# Patient Record
Sex: Male | Born: 1955
Health system: Southern US, Community
[De-identification: ages and names within clinical notes are randomized; demographics above are authoritative.]

## PROBLEM LIST (undated history)

## (undated) DIAGNOSIS — I2510 Atherosclerotic heart disease of native coronary artery without angina pectoris: Secondary | ICD-10-CM

## (undated) DIAGNOSIS — I10 Essential (primary) hypertension: Secondary | ICD-10-CM

## (undated) DIAGNOSIS — E7800 Pure hypercholesterolemia, unspecified: Secondary | ICD-10-CM

## (undated) DIAGNOSIS — M109 Gout, unspecified: Secondary | ICD-10-CM

## (undated) HISTORY — PX: CAROTID STENT: SHX1301

---

## 2000-10-10 ENCOUNTER — Emergency Department (HOSPITAL_COMMUNITY): Admission: EM | Admit: 2000-10-10 | Discharge: 2000-10-10 | Payer: Self-pay | Admitting: Emergency Medicine

## 2001-06-03 ENCOUNTER — Encounter: Payer: Self-pay | Admitting: Emergency Medicine

## 2001-06-03 ENCOUNTER — Inpatient Hospital Stay (HOSPITAL_COMMUNITY): Admission: AD | Admit: 2001-06-03 | Discharge: 2001-06-05 | Payer: Self-pay | Admitting: Cardiology

## 2006-08-15 ENCOUNTER — Ambulatory Visit: Payer: Self-pay | Admitting: Cardiology

## 2006-08-28 ENCOUNTER — Ambulatory Visit: Payer: Self-pay | Admitting: Internal Medicine

## 2006-09-03 ENCOUNTER — Ambulatory Visit: Payer: Self-pay

## 2006-09-17 ENCOUNTER — Encounter (INDEPENDENT_AMBULATORY_CARE_PROVIDER_SITE_OTHER): Payer: Self-pay | Admitting: Specialist

## 2006-09-17 ENCOUNTER — Ambulatory Visit: Payer: Self-pay | Admitting: Internal Medicine

## 2008-03-25 ENCOUNTER — Ambulatory Visit: Payer: Self-pay | Admitting: Cardiology

## 2008-04-02 ENCOUNTER — Ambulatory Visit: Payer: Self-pay | Admitting: Cardiology

## 2009-05-10 ENCOUNTER — Encounter (INDEPENDENT_AMBULATORY_CARE_PROVIDER_SITE_OTHER): Payer: Self-pay | Admitting: *Deleted

## 2010-11-21 NOTE — Assessment & Plan Note (Signed)
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  He is currently interested in raising funds for  a research project that involves a new cardiac marker to detect acute  infarction at point of care.     Bruce Elvera Lennox Juanda Chance, MD, [facility]     BRB/MedQ  DD: 03/25/2008  DT: 03/25/2008  Job #: [id number]

## 2010-11-21 NOTE — Assessment & Plan Note (Signed)
[facility]                            CARDIOLOGY OFFICE NOTE   NAME:[name], [name]                        MRN:          [id number]  DATE:03/25/2008                            DOB:          1956-03-03    PRIMARY CARE PHYSICIAN:  [name] Garbe, MD   CLINICAL HISTORY:  [name] returned for followup management of his  coronary heart disease.  He had a diaphragmatic wall infarction in 2002  treated with a stent to the right coronary artery.  He has done quite  well from a cardiac standpoint since that time with no chest pain,  shortness of breath, or palpitations.   However, his risk factor control has been suboptimal.  He continues to  smoke, half to three-quarters packs cigarettes a day.  He had a recent  lipid profile by Dr. Wylene [name], which showed an HDL of 41, LDL of 49,  triglyceride of 585, and total cholesterol of 207.  His blood pressure  was also elevated today.   PAST HISTORY:  Significant for hypertension, and hyperlipidemia, and  cigarettes.   CURRENT MEDICATIONS:  1. Lipitor 80 mg daily.  2. TriCor 145 mg daily.  3. Chlorthalidone.  4. Plavix.  5. Toprol-XL 100 mg daily.  6. Wellbutrin 150 mg daily.  7. Potassium.  8. Aspirin.  9. Multivitamins.  10.Altace 20 mg daily.  11.Fish oil 1 capsule daily.   PHYSICAL EXAMINATION:  VITAL SIGNS:  Blood pressure is 150/60 and the  pulse is 61 and regular.  NECK:  There was no venous distention.  The carotid pulses were full  without bruits.  CHEST:  Clear.  CARDIAC:  Rhythm was regular.  There were no murmurs or gallops.  ABDOMEN:  Soft.  Normal bowel sounds.  EXTREMITIES:  Peripheral pulses were full with no peripheral edema.   Electrocardiogram showed an old diaphragmatic wall infarction.   IMPRESSION:  1. Coronary artery disease, status post diaphragmatic wall infarction      in 2002, treated with stenting of the right coronary artery.  2. Ejection fraction was 55%.  3.  Continued cigarette use.  4. Hypertension.  5. Hyperlipidemia with high triglyceride.  6. History of gout.   RECOMMENDATIONS:  I talked to [name] some further about cigarette  smoking and he may go ahead and try the Chantix with Dr. Wylene [name] gave  him along with patches.  His blood pressures are up today and in order  to bring it down he is on diuretic and an maximum dose Altace.  He said  his blood pressure was good in Dr. Deneen [name] office, so we does have him  recheck and if it remains elevated, we will need to reconsider another  drug.  Exercise and salt restriction can help with this bring it down.  His triglycerides are quite high and we might consider increasing the  dose of fish oil.  He asked about the prescription form of fish oil and  we prescribed the Lovaza 2 g twice a day.  We will arrange a cardiac  followup in a year.   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  He is currently interested in raising  funds for our research project that involves a new cardiac marker to  detect acute infarction at point of care.     Bruce Elvera Lennox Juanda Chance, MD, [facility]  Electronically Signed    BRB/MedQ  DD: 03/25/2008  DT: 03/25/2008  Job #: [id number]

## 2010-11-24 NOTE — Discharge Summary (Signed)
[place]  Patient:    [name], [name] Visit Number: [id number] MRN: [id number]          Service Type: EMS Location: ED Attending Physician:  Sandi Raveling Dictated by:   Tereso Newcomer, P.A.-C. Admit Date:  06/03/2001 Discharge Date: 06/03/2001   CC:         Emer[facility]are in [place]   Discharge Summary  DATE OF BIRTH:  Apr 21, 1956  DISCHARGE DIAGNOSES: 1. Status post acute inferior myocardial infarction treated with percutaneous    transluminal coronary angioplasty and stenting to the right coronary artery    with reduction of stenosis of 95 to 0% by Dr. Charlies Constable on June 03, 2001. 2. Coronary artery disease. 3. Ejection fraction approximately 55%. 4. Hypertension. 5. Tobacco abuse. 6. Mixed dyslipidemia (triglycerides 561, HDL 31, total cholesterol 478, LDL    not calculated).  HOSPITAL COURSE:  This 55 year old male presented to the emergency room with two hours of anterior chest pressure moderately severe with radiation to his arms.  He also complained of malaise.  He denied dyspnea and nausea.  His blood pressure on admission was 180/120.  Sublingual nitroglycerin almost relieved his pain.  He was placed on IV nitroglycerin, aspirin, and heparin. He also received morphine at a total of 8 mg.  EKG revealed signs of acute inferior posterior MI and he was in normal sinus rhythm.  It was decided that he should be taken emergently to the cardiac catheterization lab.  Dr. Charlies Constable performed the procedure.  His coronary angiogram revealed LAD and circumflex were normal and his RCA was 95% mid and 90% acute marginal stenosis.  His LV revealed inferior apical akinesis and EF of 55%.  Stenting was performed to the RCA with reduction stenosis of 95 to 0%.  The patient was placed on a low-risk protocol.  Discharge is targeted for Thursday, November 28.  The patient remained stable post catheterization without  any complications.  His groin was stable without bruits or stenosis.  He did have some persistent ST elevation post procedure, but this had resolved by the morning of November 27.  A stent was started for his abnormal lipid profile. He was placed on Lopressor on admission.  This was adjusted for better blood pressure control on the morning of November 28.  Patient was found to be in stable condition.  His blood pressure was 160/95, heart rate 95, and his catheter site was benign.  His peak CK was 1700.  He was felt stable enough for discharge to my home.  Follow-up is recommended in two weeks and he will continue on Diovan, aspirin, beta blocker, ACE inhibitor, and HMG co A reductase inhibitor.  LABS:  White blood cell count 20,200, hemoglobin 14.7, hematocrit 41.9, platelet count 315,000, INR 1.0.  Sodium 136, potassium 4.1, chloride 107, CO2 22, glucose 150, BUN 12, creatinine 0.9, total bilirubin 0.6, alkaline phosphatase 73, ST 22, ALT 27, total protein 7.4, albumin 4.3.  Peak CK 1673, CK-MB 330.5.  Liver profile as noted in discharge diagnoses.  Chest x-ray on admission:  Cardiomegaly with mild vascular congestion.  DISCHARGE MEDICATIONS: 1. Plavix 75 mg q.d. x 1 month. 2. Coated aspirin 325 ng q.d. 3. Lipitor 10 mg q.d. 4. Diovan 300 mg q.d. 5. Toprol XL 100 mg q.d. 6. Chlorthalidone 25 mg one-half tablet q.d.  ACTIVITY:  No lifting, sexual activity, or heavy exertion until seen by the physician in follow-up.  No tobacco products.  DIET:  Low salt, low fat, low cholesterol.  WOUND CARE:  The patient should call our office for any catheterization site bruising, drainage, swelling, or discomfort.  FOLLOW-UP:  He has a follow-up appointment with Dr. Charlies Constable on Wednesday, December 11, at 9:15 a.m. Dictated by:   Tereso Newcomer, P.A.-C. Attending Physician:  Sandi Raveling DD:  06/05/01 TD:  06/05/01 Job: [id number]

## 2010-11-24 NOTE — Assessment & Plan Note (Signed)
[facility]                            CARDIOLOGY OFFICE NOTE   NAME:[name], [name]                        MRN:          [id number]  DATE:08/15/2006                            DOB:          05-Mar-1956    PRIMARY CARE PHYSICIAN:  Dr. Guerry [name].   CLINICAL HISTORY:  [name] returned for a followup cardiac visit.  I had last seen him in 2005 but he had not had regular followup since  then and recently got established with Dr. Wylene [name] who arranged for him  to come back here for a followup visit.  He had a myocardial infarction  in 2002 treated with stenting of the right coronary artery.  At that  time, he had no major disease in the circumflex and right coronary  artery and his ejection fraction was 55%.  He had done well from a  cardiac standpoint since that time.  Has had no recent chest pain,  shortness of breath, or palpitations.   His risk factor modification has not been very good.  Dr. Wylene [name] just  saw him recently and is working on that.  He is still smoking and Dr.  Wylene [name] gave him some Chantix to help with this.  He indicated that his  triglycerides were also quite high and he is working on cutting back on  his carbohydrates in his diet.   PAST MEDICAL HISTORY:  Significant for hypertension and hyperlipidemia.   CURRENT MEDICATIONS:  Includes Chantix which he has not yet started,  Lipitor, TriCor, Chlorthalidone, Plavix, Altace, Toprol, Wellbutrin,  potassium, aspirin, and multivitamins.   EXAM:  Blood pressure is 150/94 and the pulse 71 and regular.  He  indicated his pressure was 117/60 in Dr. Deneen [name] office.  There was no venous distention.  The carotid pulses were full without  bruits.  CHEST:  Clear without rales or rhonchi.  CARDIAC:  Rhythm was regular.  There were no murmurs or gallops.  ABDOMEN:  Soft without organomegaly.  Peripheral pulses were full.  There was no peripheral edema.   He had an  electrocardiogram that he brought with him from Dr. Deneen [name]  office which showed small inferior Q-waves and was otherwise normal.   IMPRESSION:  1. Coronary artery disease status post diaphragmatic wall infarction      with stenting of the right coronary artery in November 2002 now      stable.  2. Ejection fraction 55%.  3. Continued cigarette use.  4. Hyperlipidemia with low HDL and high triglycerides.  5. Hypertension.  6. History of gout.   RECOMMENDATION:  [name] appears to be doing well with his heart at  present but his risk factor modification is far from ideal.  I further  encouraged him to work on getting off the cigarettes and further  encouraged him to cut back on the bad carbohydrates in his diet and try  and get his weight down.  He has not had a stress test since 2003 and we  will plan to arrange for him to have a rest stress exercise Myoview  scan.  I  will see him back in followup in a year.     Bruce Elvera Lennox Juanda Chance, MD, [facility]  Electronically Signed    BRB/MedQ  DD: 08/15/2006  DT: 08/15/2006  Job #: [id number]   cc:   [name], M.D.

## 2010-11-24 NOTE — Cardiovascular Report (Signed)
[place]  Patient:    [name], [name] Visit Number: [id number] MRN: [id number]          Service Type: MED Location: [facility] [id number] Attending Physician:  Nelta Numbers Dictated by:   Everardo Beals Juanda Chance, M.D. [facility] Proc. Date: 06/03/01 Admit Date:  06/03/2001   CC:         [name]. Dietrich Pates, M.D. [facility], [place], [place]  Cardiac Catheterization Lab   Cardiac Catheterization  CLINICAL HISTORY:  [name] is 55 years old and has no prior history of known heart disease.  He does have a history of hypertension and is a smoker. He developed the onset of chest pain at 5:00 p.m. and presented to [facility] Emergency Room at [phone] where his EKG showed changes consistent with an inferior wall myocardial infarction.  Dr. Dietrich Pates was consulted and made arrangements for him to come to the [facility] catheterization lab for intervention.  DESCRIPTION OF PROCEDURE:  The procedure was performed via the right femoral artery using an arterial sheath and 6-French preformed coronary catheters.  A venous sheath was placed in the right femoral vein for access.  A front wall arterial puncture was performed.  Omnipaque contrast was used.  After completion of the diagnostic study, we made a decision to proceed with intervention on the right coronary artery.  The patient was given additional heparin to prolong an ACT of greater than 200 seconds and was given abciximab bolus and infusion.  He had already received aspirin and heparin in the emergency department at [facility].  We also gave 300 mg of Plavix at the end of the procedure.  We used a 6-French JR4 guiding catheter with sideholes and a short luge wire.  We crossed the lesion in the proximal right coronary artery without difficulty.  Initially, there was a 95% stenosis with TIMI-3 flow.  We initially predilated with a 3.5- x 15-mm Quantum Maverick performing two inflations up to six atmospheres  for 28 seconds.  We then deployed a 3.0- x 16-mm Express deploying this with one inflation of eight atmospheres for 34 seconds and a second inflation of 14 atmospheres for 36 seconds with the balloon pulled inside the distal edge.  Repeat diagnostic studies were then performed through the guiding catheter.  Initially, the ST segments were about 1.5 mm elevated at the time the artery was opened on the initial angiogram. Following stenting, ST segments went up to about 3 mm and stayed there despite intracoronary Verapamil until the patient left the catheterization laboratory. He also had residual mild to moderate pain despite having TIMI-3 flow in the artery.  RESULTS: 1. The left main coronary artery:  The left main coronary artery was free of    significant disease. 2. The left anterior descending artery:  The left anterior descending artery    gave rise to a large diagonal branch and two small diagonal branches and a    septal perforator.  These and the LAD proper were free of significant    disease. 3. The circumflex artery:  The circumflex artery gave rise to a large marginal    branch which bifurcated into three sub branches.  The circumflex also gave    rise to an AV branch which terminated in a posterolateral branch. 4. The right coronary artery:  The right coronary artery was a moderate sized    vessel that gave rise to a right ventricular branch, a posterior descending    branch, and  three posterolateral branches.  There was 90% stenosis at the    ostium of the marginal branch.  There was 95% stenosis in the mid to distal    vessel with TIMI-3 flow distally.  Left ventriculogram:  The left ventriculogram, performed in the RAO projection, showed akinesis of the inferoapical segment.  The overall wall motion was good with an estimated ejection fraction of 55%.  Following stenting of the right coronary artery, the stenosis improved from 95% to 0% and the flow remained TIMI-3  flow.  The myocardial blush appeared to be grade 3 at the end of the procedure despite the fact that the ST segments became more elevated following stent deployment.  CONCLUSION: 1. Acute diaphragmatic wall infarction with 95% stenosis in the mid right    coronary artery, no major obstruction in the circumflex and right coronary    artery, and inferoapical akinesis. 2. Successful stenting of the mid right coronary artery lesion with    improvement in percent area narrowing from 95% to 0% with TIMI-3 flow    before and after intervention.  DISPOSITION:  The patient was transferred to the post angioplasty unit for further observation.  Will classify him as low risk and target discharge on day two. Dictated by:   Everardo Beals Juanda Chance, M.D. [facility] Attending Physician:  Nelta Numbers DD:  06/03/01 TD:  06/03/01 Job: [id number]

## 2010-11-24 NOTE — Assessment & Plan Note (Signed)
[facility] [place] [facility]                         GASTROENTEROLOGY OFFICE NOTE   NAME:[name], [name]                        MRN:          [id number]  DATE:08/28/2006                            DOB:          1955-10-22    REASON FOR CONSULTATION:  Hemoccult positive stool.   HISTORY:  This is a pleasant 55 year old white male with a history of  hypertension, hyperlipidemia, chronic tobacco abuse, and coronary artery  disease status post acute inferior myocardial infarction and status post  right coronary artery stent placement in November 2002. He is on chronic  aspirin and Plavix therapy. The patient underwent a recent comprehensive  physical examination and was noted to have hemoccult positive stool on  home testing. He is GI review of systems is remarkable for minor  intermittent rectal bleeding, as manifested by blood on the tissue only.  He denies abdominal pain, change in bowel habits, or weight loss. He  also has chronic problems with indigestion and heartburn, which have  been particularly problematic over the past 1 1/2 - 2 years. For this he  has been using Rolaids on a regular basis. He denies a prior GI history  or a prior history of GI procedures.   PAST MEDICAL HISTORY:  1. Hypertension.  2. Coronary artery disease status post myocardial infarction with      coronary artery stent placement in November 2002.  3. Dyslipidemia.  4. Chronic tobacco abuse.   PAST SURGICAL HISTORY:  Status post tonsillectomy.   ALLERGIES:  No known drug allergies.   CURRENT MEDICATIONS:  1. Lipitor 80 mg daily.  2. Tricor 145 mg daily.  3. Chlorthalidone 25 mg 1/2 tablet daily.  4. Plavix 75 mg daily.  5. Altace 15 mg at night.  6. Toprol XL 100 mg daily.  7. Wellbutrin XL 150 mg daily.  8. Potassium 20 mEq daily.  9. Aspirin 81 mg daily.  10.Multivitamin daily.  11.Chantix. Not yet started, but anticipating doing so.   FAMILY HISTORY:  There is no family  history of colon cancer or colon  polyps.   SOCIAL HISTORY:  The patient is married with 1 daughter and lives with  his spouse. He has a four year college degree. 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 He smokes daily and uses  alcohol.   REVIEW OF SYSTEMS:  Per diagnostic evaluation form.   PHYSICAL EXAMINATION:  GENERAL:  The patient is alert and oriented.  VITAL SIGNS: Blood pressure 140/70, heart rate 78, weight 238.4 pounds.  He is 6 feet 4 inches in height.  HEENT: Sclerae anicteric. Conjunctiva are pink. Oral mucosa intact. No  adenopathy.  LUNGS: Clear.  HEART: Regular.  ABDOMEN: Soft without tenderness, mass, or hernia. Good bowel sounds  heard.  EXTREMITIES: Without edema.   IMPRESSION:  1. Hemoccult positive stool.  2. Chronic gastroesophageal reflux disease.  3. Multiple medical problems.  4. Chronic aspirin and Plavix therapy, presumably for prior coronary      artery stent.   RECOMMENDATIONS:  1. Colonoscopy with polypectomy if necessary. This is to evaluate  hemoccult stool, as well as provide baseline colorectal neoplasia      screening. The nature of the procedure, as well as the risks,      benefits, and alternatives were reviewed. He understood and agreed      to proceed.  2. Upper endoscopy to evaluate chronic reflux symptoms, as well as      hemoccult stool. Rule out Barrett's esophagus.Rule out esophagitis.      Rule out other significant upper gastrointestinal mucosal      pathology, such as erosions or ulcer disease.  3. Consider chronic proton pump inhibitor therapy.  4. Detailed discussion today with the patient regarding the      ramifications of performing his procedures on Plavix therapy versus      off of Plavix therapy. The pros and cons were discussed.      Accordingly, we will check with the patient's cardiologist, Dr.      Charlies Constable, to see if we might hold Plavix therapy for 5 days      prior to his  procedure in order to minimize the risk of significant      procedure related bleeding complications. We would, however,      recommend that he stay on his aspirin. If this is not acceptable,      then we would perform the exams on Plavix realizing that      significant pathology (e.g., removal of large or bulky polyps)      might not be dealt with during the initial exam.   The patient understands the issues clearly.     Wilhemina Bonito. Marina Goodell, MD  Electronically Signed    JNP/MedQ  DD: 08/28/2006  DT: 08/29/2006  Job #: [id number]   cc:   Gaspar Garbe, M.D.  Bruce Elvera Lennox Juanda Chance, MD, [facility]

## 2011-08-01 ENCOUNTER — Encounter: Payer: Self-pay | Admitting: Internal Medicine

## 2012-05-08 ENCOUNTER — Encounter: Payer: Self-pay | Admitting: Internal Medicine

## 2013-02-12 ENCOUNTER — Encounter: Payer: Self-pay | Admitting: Cardiology

## 2013-10-23 ENCOUNTER — Encounter (HOSPITAL_COMMUNITY): Payer: Self-pay | Admitting: Emergency Medicine

## 2013-10-23 ENCOUNTER — Emergency Department (INDEPENDENT_AMBULATORY_CARE_PROVIDER_SITE_OTHER)
Admission: EM | Admit: 2013-10-23 | Discharge: 2013-10-23 | Disposition: A | Discharge status: Home / Self Care | Payer: PRIVATE HEALTH INSURANCE | Source: Home / Self Care | Attending: Family Medicine | Admitting: Family Medicine

## 2013-10-23 DIAGNOSIS — M10031 Idiopathic gout, right wrist: Secondary | ICD-10-CM

## 2013-10-23 DIAGNOSIS — M109 Gout, unspecified: Secondary | ICD-10-CM

## 2013-10-23 HISTORY — DX: Gout, unspecified: M10.9

## 2013-10-23 HISTORY — DX: Pure hypercholesterolemia, unspecified: E78.00

## 2013-10-23 HISTORY — DX: Essential (primary) hypertension: I10

## 2013-10-23 HISTORY — DX: Atherosclerotic heart disease of native coronary artery without angina pectoris: I25.10

## 2013-10-23 MED ORDER — COLCHICINE 0.6 MG PO TABS: 0.6000 mg | ORAL_TABLET | Freq: Two times a day (BID) | ORAL | Status: DC

## 2013-10-23 MED ORDER — INDOMETHACIN 50 MG PO CAPS: 50.0000 mg | ORAL_CAPSULE | Freq: Three times a day (TID) | ORAL | Status: DC

## 2013-10-23 NOTE — ED Notes (Signed)
Triage assessment and history for another patient entered onto this patient record (including a hysterectomy on a male patient)

## 2013-10-23 NOTE — ED Provider Notes (Signed)
CSN: [id number]     Arrival date & time 10/23/13  6060 History   First MD Initiated Contact with Patient 10/23/13 [phone]     Chief Complaint  Patient presents with  . Gout   (Consider location/radiation/quality/duration/timing/severity/associated sxs/prior Treatment) Patient is a 58 y.o. male presenting with wrist pain. The history is provided by the patient.  Wrist Pain This is a new problem. The current episode started more than 2 days ago (gout flare for 4d, ). The problem has been gradually worsening. Pertinent negatives include no chest pain and no abdominal pain. The symptoms are aggravated by bending.    Past Medical History  Diagnosis Date  . Hypertension   . Gout   . Coronary artery disease   . High cholesterol    Past Surgical History  Procedure Laterality Date  . Carotid stent     History reviewed. No pertinent family history. History  Substance Use Topics  . Smoking status: Current Every Day Smoker  . Smokeless tobacco: Not on file  . Alcohol Use: Yes    Review of Systems  Constitutional: Negative.   Cardiovascular: Negative for chest pain.  Gastrointestinal: Negative.  Negative for abdominal pain.  Musculoskeletal: Positive for arthralgias and joint swelling.  Skin: Negative.     Allergies  Review of patient's allergies indicates no known allergies.  Home Medications   Prior to Admission medications   Medication Sig Start Date End Date Taking? Authorizing Provider  amLODipine (NORVASC) 5 MG tablet Take 5 mg by mouth daily.   Yes Historical Provider, MD  aspirin 81 MG tablet Take 81 mg by mouth daily.   Yes Historical Provider, MD  losartan (COZAAR) 100 MG tablet Take 100 mg by mouth daily.   Yes Historical Provider, MD  metoprolol (LOPRESSOR) 50 MG tablet Take 50 mg by mouth 2 (two) times daily.   Yes Historical Provider, MD  mometasone (NASONEX) 50 MCG/ACT nasal spray Place 2 sprays into the nose daily.   Yes Historical Provider, MD  simvastatin (ZOCOR)  40 MG tablet Take 40 mg by mouth daily.   Yes Historical Provider, MD  traMADol (ULTRAM) 50 MG tablet Take by mouth every 6 (six) hours as needed.   Yes Historical Provider, MD   BP 189/118  Pulse 85  Temp(Src) 98.6 F (37 C) (Oral)  Resp 20  SpO2 94% Physical Exam  Nursing note and vitals reviewed. Constitutional: He is oriented to person, place, and time. He appears well-developed and well-nourished.  Musculoskeletal: He exhibits tenderness.       Right wrist: He exhibits decreased range of motion, tenderness and swelling.  Neurological: He is alert and oriented to person, place, and time.  Skin: Skin is warm and dry.    ED Course  Procedures (including critical care time) Labs Review Labs Reviewed - No data to display  No results found for this or any previous visit. Imaging Review No results found.   MDM   1. Acute idiopathic gout of right wrist        Billy Fischer, MD 10/23/13 [phone]

## 2013-12-16 ENCOUNTER — Telehealth: Payer: Self-pay | Admitting: Emergency Medicine

## 2013-12-16 ENCOUNTER — Encounter: Payer: Self-pay | Admitting: Cardiology

## 2013-12-16 ENCOUNTER — Ambulatory Visit: Payer: PRIVATE HEALTH INSURANCE | Attending: Cardiology | Admitting: Cardiology

## 2013-12-16 VITALS — BP 157/95 | HR 62 | Temp 98.0°F | Resp 20 | Ht 76.0 in | Wt 263.0 lb

## 2013-12-16 DIAGNOSIS — I252 Old myocardial infarction: Secondary | ICD-10-CM | POA: Insufficient documentation

## 2013-12-16 DIAGNOSIS — F17200 Nicotine dependence, unspecified, uncomplicated: Secondary | ICD-10-CM | POA: Insufficient documentation

## 2013-12-16 DIAGNOSIS — E782 Mixed hyperlipidemia: Secondary | ICD-10-CM

## 2013-12-16 DIAGNOSIS — Z720 Tobacco use: Secondary | ICD-10-CM

## 2013-12-16 DIAGNOSIS — E7800 Pure hypercholesterolemia, unspecified: Secondary | ICD-10-CM | POA: Insufficient documentation

## 2013-12-16 DIAGNOSIS — I10 Essential (primary) hypertension: Secondary | ICD-10-CM | POA: Insufficient documentation

## 2013-12-16 DIAGNOSIS — I2510 Atherosclerotic heart disease of native coronary artery without angina pectoris: Secondary | ICD-10-CM

## 2013-12-16 DIAGNOSIS — Z7982 Long term (current) use of aspirin: Secondary | ICD-10-CM | POA: Insufficient documentation

## 2013-12-16 DIAGNOSIS — Z9861 Coronary angioplasty status: Secondary | ICD-10-CM | POA: Insufficient documentation

## 2013-12-16 MED ORDER — FENOFIBRATE 160 MG PO TABS: 160.0000 mg | ORAL_TABLET | Freq: Every day | ORAL | Status: DC

## 2013-12-16 MED ORDER — FENOFIBRATE 145 MG PO TABS: 145.0000 mg | ORAL_TABLET | Freq: Every day | ORAL | Status: DC

## 2013-12-16 MED ORDER — LISINOPRIL 40 MG PO TABS: 40.0000 mg | ORAL_TABLET | Freq: Every day | ORAL | Status: DC

## 2013-12-16 NOTE — Telephone Encounter (Signed)
Attempted to reach pt in regards to questions concerning lab work. Left message for pt to call when received

## 2013-12-16 NOTE — Progress Notes (Signed)
HPI [name] is a 58 year old white male who comes in today to establish with [place] as cardiology.  He is a former patient of Dr. Nichola Sizer who put in a stent in 2008. I cannot find details of this in the EMR.  He separated from his wife a couple months ago. He quit taking his meds and said he just didn't care. Over Memorial Day weekend, he had a myocardial infarction in [place] with 3 stents placed at that time. He does not have any records outlining this today. He forgot his wallet stent card. Records have been requested from [facility] in [place].  He's had no angina or chest discomfort or arm pain since then. He presented with bilateral arm aching with this last heart attack. He is taking his meds.  He continues to smoke heavily. He desires to establish a primary care here today.   He has not slept well in the last several years. He does not sound like he has apnea.  Again, I have no records.  Past Medical History  Diagnosis Date  . Hypertension   . Gout   . Coronary artery disease   . High cholesterol     Current Outpatient Prescriptions  Medication Sig Dispense Refill  . aspirin 325 MG tablet Take 325 mg by mouth daily.      . clopidogrel (PLAVIX) 75 MG tablet Take 75 mg by mouth daily with breakfast.      . fenofibrate (TRICOR) 145 MG tablet Take 145 mg by mouth daily.      Marland Kitchen lisinopril (PRINIVIL,ZESTRIL) 20 MG tablet Take 20 mg by mouth daily.      . metoprolol (LOPRESSOR) 50 MG tablet Take 50 mg by mouth 2 (two) times daily.      . potassium chloride SA (K-DUR,KLOR-CON) 20 MEQ tablet Take 20 mEq by mouth 2 (two) times daily.      . rosuvastatin (CRESTOR) 20 MG tablet Take 20 mg by mouth daily.      Marland Kitchen aspirin 81 MG tablet Take 81 mg by mouth daily.       No current facility-administered medications for this visit.    No Known Allergies  Family History  Problem Relation Age of Onset  . Hypertension Father     History   Social History  .  Marital Status: Married    Spouse Name: N/A    Number of Children: N/A  . Years of Education: N/A   Occupational History  . Not on file.   Social History Main Topics  . Smoking status: Current Every Day Smoker  . Smokeless tobacco: Not on file  . Alcohol Use: Yes  . Drug Use: Not on file  . Sexual Activity: Not on file   Other Topics Concern  . Not on file   Social History Narrative  . No narrative on file    ROS ALL NEGATIVE EXCEPT THOSE NOTED IN HPI  PE  General Appearance: well developed, well nourished in no acute distress, looks extremely tired and stressed HEENT: symmetrical face, PERRLA, good dentition  Neck: no JVD, thyromegaly, or adenopathy, trachea midline Chest: symmetric without deformity Cardiac: PMI non-displaced, RRR, normal S1, S2, no gallop or murmur Lung: clear to ausculation and percussion Vascular: all pulses full without bruits  Abdominal: nondistended, nontender, good bowel sounds, no HSM, no bruits Extremities: no cyanosis, clubbing or edema, no sign of DVT, no varicosities  Skin: normal color, no rashes Neuro: alert and oriented x 3, non-focal Pysch: normal  affect, anxious  EKG Not repeated. Awaiting records. BMET No results found for this basename: na, k, cl, co2, glucose, bun, creatinine, calcium, gfrnonaa, gfraa    Lipid Panel  No results found for this basename: chol, trig, hdl, cholhdl, vldl, ldlcalc    CBC No results found for this basename: wbc, rbc, hgb, hct, plt, mcv, mch, mchc, rdw, neutrabs, lymphsabs, monoabs, eosabs, basosabs

## 2013-12-16 NOTE — Patient Instructions (Addendum)
Your fenofibrate has been increased to 160 mg daily. Your lisinopril has been increased to 40 mg daily. Please return next week for fasting labwork. Please establish care with a primary care physician.   It was wonderful meeting you today.  Thanks so much!

## 2013-12-16 NOTE — Progress Notes (Signed)
Patient with history of hypertension, hyperlipidemia, and coronary artery disease. Patient had myocardial infarction on 12/02/13 while in [place] and stents x3 where placed at [facility] in [place]. Patient denies chest pain, dizziness, shortness of breath, or headache Complains of fatigue and insomnia.

## 2013-12-16 NOTE — Addendum Note (Signed)
Addended by: Theodis Shove on: 12/16/2013 12:40 PM   Modules accepted: Orders

## 2013-12-16 NOTE — Assessment & Plan Note (Signed)
He is status post multivessel PCI with recent myocardial infarction. At that time he had 3 additional stents placed. Details not known and records pending. We'll continue with secondary preventative therapy and I've renewed his cardiovascular meds. I've increased lisinopril to 40 mg a day. We'll obtain comprehensive metabolic profile and fasting lipids in 2 weeks. Establish with primary care. I'll see him back in 6 months. Advised to stop smoking.

## 2013-12-23 ENCOUNTER — Other Ambulatory Visit: Payer: PRIVATE HEALTH INSURANCE

## 2013-12-24 ENCOUNTER — Ambulatory Visit: Payer: PRIVATE HEALTH INSURANCE | Attending: Internal Medicine

## 2013-12-24 DIAGNOSIS — I2510 Atherosclerotic heart disease of native coronary artery without angina pectoris: Secondary | ICD-10-CM

## 2013-12-25 LAB — COMPLETE METABOLIC PANEL WITH GFR
ALT: 21 U/L (ref 0–53)
AST: 17 U/L (ref 0–37)
Albumin: 4.2 g/dL (ref 3.5–5.2)
Alkaline Phosphatase: 52 U/L (ref 39–117)
BUN: 18 mg/dL (ref 6–23)
CO2: 23 meq/L (ref 19–32)
Calcium: 9.8 mg/dL (ref 8.4–10.5)
Chloride: 103 mEq/L (ref 96–112)
Creat: 1.08 mg/dL (ref 0.50–1.35)
GFR, Est African American: 88 mL/min
GFR, Est Non African American: 76 mL/min
Glucose, Bld: 94 mg/dL (ref 70–99)
Potassium: 4.5 mEq/L (ref 3.5–5.3)
Sodium: 139 mEq/L (ref 135–145)
Total Bilirubin: 0.5 mg/dL (ref 0.2–1.2)
Total Protein: 6.8 g/dL (ref 6.0–8.3)

## 2013-12-25 LAB — LIPID PANEL
Cholesterol: 181 mg/dL (ref 0–200)
HDL: 32 mg/dL — ABNORMAL LOW (ref >39)
LDL CALC: 86 mg/dL (ref 0–99)
TRIGLYCERIDES: 313 mg/dL — AB (ref <150)
Total CHOL/HDL Ratio: 5.7 Ratio
VLDL: 63 mg/dL — ABNORMAL HIGH (ref 0–40)

## 2013-12-31 ENCOUNTER — Encounter: Payer: Self-pay | Admitting: Internal Medicine

## 2013-12-31 ENCOUNTER — Ambulatory Visit: Payer: 59 | Attending: Internal Medicine | Admitting: Internal Medicine

## 2013-12-31 VITALS — Ht 76.0 in | Wt 267.0 lb

## 2013-12-31 DIAGNOSIS — M109 Gout, unspecified: Secondary | ICD-10-CM

## 2013-12-31 DIAGNOSIS — I10 Essential (primary) hypertension: Secondary | ICD-10-CM | POA: Diagnosis not present

## 2013-12-31 DIAGNOSIS — F17200 Nicotine dependence, unspecified, uncomplicated: Secondary | ICD-10-CM

## 2013-12-31 DIAGNOSIS — I2510 Atherosclerotic heart disease of native coronary artery without angina pectoris: Secondary | ICD-10-CM | POA: Diagnosis present

## 2013-12-31 DIAGNOSIS — E785 Hyperlipidemia, unspecified: Secondary | ICD-10-CM | POA: Diagnosis not present

## 2013-12-31 DIAGNOSIS — G4700 Insomnia, unspecified: Secondary | ICD-10-CM | POA: Insufficient documentation

## 2013-12-31 DIAGNOSIS — E782 Mixed hyperlipidemia: Secondary | ICD-10-CM

## 2013-12-31 DIAGNOSIS — Z720 Tobacco use: Secondary | ICD-10-CM

## 2013-12-31 DIAGNOSIS — M10442 Other secondary gout, left hand: Secondary | ICD-10-CM

## 2013-12-31 LAB — POCT GLYCOSYLATED HEMOGLOBIN (HGB A1C): Hemoglobin A1C: 5.6

## 2013-12-31 MED ORDER — CLOPIDOGREL BISULFATE 75 MG PO TABS: 75.0000 mg | ORAL_TABLET | Freq: Every day | ORAL | Status: DC

## 2013-12-31 MED ORDER — LISINOPRIL 40 MG PO TABS: 40.0000 mg | ORAL_TABLET | Freq: Every day | ORAL | Status: DC

## 2013-12-31 MED ORDER — COLCHICINE 0.6 MG PO TABS: 0.6000 mg | ORAL_TABLET | Freq: Every day | ORAL | Status: DC

## 2013-12-31 MED ORDER — ROSUVASTATIN CALCIUM 20 MG PO TABS: 20.0000 mg | ORAL_TABLET | Freq: Every day | ORAL | Status: DC

## 2013-12-31 MED ORDER — METOPROLOL TARTRATE 50 MG PO TABS: 50.0000 mg | ORAL_TABLET | Freq: Two times a day (BID) | ORAL | Status: DC

## 2013-12-31 MED ORDER — ESZOPICLONE 2 MG PO TABS: 2.0000 mg | ORAL_TABLET | Freq: Every evening | ORAL | Status: DC | PRN

## 2013-12-31 MED ORDER — BUPROPION HCL ER (SR) 150 MG PO TB12: 150.0000 mg | ORAL_TABLET | Freq: Two times a day (BID) | ORAL | Status: DC

## 2013-12-31 MED ORDER — INDOMETHACIN 25 MG PO CAPS: 25.0000 mg | ORAL_CAPSULE | Freq: Two times a day (BID) | ORAL | Status: DC

## 2013-12-31 MED ORDER — FENOFIBRATE 160 MG PO TABS: 160.0000 mg | ORAL_TABLET | Freq: Every day | ORAL | Status: DC

## 2013-12-31 NOTE — Progress Notes (Signed)
Patient ID: [name], male   DOB: 06-16-1956, 58 y.o.   MRN: [id number]   [name], is a 58 y.o. male  [id number]  [id number]  DOB - [birth year]  CC:  Chief Complaint  Patient presents with  . Establish Care       HPI: [name] is a 58 y.o. male here today to establish medical care. Patient has history of hypertension, Gout,  hyperlipidemia and coronary artery disease , had MI in Dec 02 2013 [place] and had stents 3 placed. Patient came recently to see Dr. Verl Blalock in our cardiology clinic and was referred to establish with primary care physician. Patient is here today for that reason. He has no significant complaints today except for occasional pain behind his eyes bilaterally , but no discharge, no redness, no blurry vision. Patient continues to smoke heavily but ready to quit and is requesting medication to assist in quitting. He has not been sleeping well lately , because he separated from his wife few months ago. Patient has No headache, No chest pain, No abdominal pain - No Nausea, No new weakness tingling or numbness, No Cough - SOB.  No Known Allergies Past Medical History  Diagnosis Date  . Hypertension   . Gout   . Coronary artery disease   . High cholesterol    Current Outpatient Prescriptions on File Prior to Visit  Medication Sig Dispense Refill  . aspirin 325 MG tablet Take 325 mg by mouth daily.      . potassium chloride SA (K-DUR,KLOR-CON) 20 MEQ tablet Take 20 mEq by mouth 2 (two) times daily.       No current facility-administered medications on file prior to visit.   Family History  Problem Relation Age of Onset  . Hypertension Father    History   Social History  . Marital Status: Married    Spouse Name: N/A    Number of Children: N/A  . Years of Education: N/A   Occupational History  . Not on file.   Social History Main Topics  . Smoking status: Current Every Day Smoker  . Smokeless tobacco: Not on file  . Alcohol Use: Yes    . Drug Use: Not on file  . Sexual Activity: Not on file   Other Topics Concern  . Not on file   Social History Narrative  . No narrative on file    Review of Systems: Constitutional: Negative for fever, chills, diaphoresis, activity change, appetite change and fatigue. HENT: Negative for ear pain, nosebleeds, congestion, facial swelling, rhinorrhea, neck pain, neck stiffness and ear discharge.  Eyes: Negative for pain, discharge, redness, itching and visual disturbance. Respiratory: Negative for cough, choking, chest tightness, shortness of breath, wheezing and stridor.  Cardiovascular: Negative for chest pain, palpitations and leg swelling. Gastrointestinal: Negative for abdominal distention. Genitourinary: Negative for dysuria, urgency, frequency, hematuria, flank pain, decreased urine volume, difficulty urinating and dyspareunia.  Musculoskeletal: Negative for back pain, joint swelling, arthralgia and gait problem. Neurological: Negative for dizziness, tremors, seizures, syncope, facial asymmetry, speech difficulty, weakness, light-headedness, numbness and headaches.  Hematological: Negative for adenopathy. Does not bruise/bleed easily. Psychiatric/Behavioral: Negative for hallucinations, behavioral problems, confusion, dysphoric mood, decreased concentration and agitation.    Objective:  There were no vitals filed for this visit.  Physical Exam: Constitutional: Patient appears well-developed and well-nourished. No distress. HENT: Normocephalic, atraumatic, External right and left ear normal. Oropharynx is clear and moist.  Eyes: Conjunctivae and EOM are normal. PERRLA, no scleral icterus. Neck: Normal  ROM. Neck supple. No JVD. No tracheal deviation. No thyromegaly. CVS: RRR, S1/S2 +, no murmurs, no gallops, no carotid bruit.  Pulmonary: Effort and breath sounds normal, no stridor, rhonchi, wheezes, rales.  Abdominal: Soft. BS +, no distension, tenderness, rebound or guarding.   Musculoskeletal: Normal range of motion. No edema and no tenderness.  Lymphadenopathy: No lymphadenopathy noted, cervical, inguinal or axillary Neuro: Alert. Normal reflexes, muscle tone coordination. No cranial nerve deficit. Skin: Skin is warm and dry. No rash noted. Not diaphoretic. No erythema. No pallor. Psychiatric: Normal mood and affect. Behavior, judgment, thought content normal.  No results found for this basename: WBC, HGB, HCT, MCV, PLT   Lab Results  Component Value Date   CREATININE 1.08 12/24/2013   BUN 18 12/24/2013   NA 139 12/24/2013   K 4.5 12/24/2013   CL 103 12/24/2013   CO2 23 12/24/2013    No results found for this basename: HGBA1C   Lipid Panel     Component Value Date/Time   CHOL 181 12/24/2013 0907   TRIG 313* 12/24/2013 0907   HDL 32* 12/24/2013 0907   CHOLHDL 5.7 12/24/2013 0907   VLDL 63* 12/24/2013 0907   LDLCALC 86 12/24/2013 0907       Assessment and plan:   1. Arteriosclerotic coronary artery disease  - clopidogrel (PLAVIX) 75 MG tablet; Take 1 tablet (75 mg total) by mouth daily with breakfast.  Dispense: 90 tablet; Refill: 3 - fenofibrate 160 MG tablet; Take 1 tablet (160 mg total) by mouth daily.  Dispense: 90 tablet; Refill: 3 - lisinopril (PRINIVIL,ZESTRIL) 40 MG tablet; Take 1 tablet (40 mg total) by mouth daily.  Dispense: 90 tablet; Refill: 3 - Amb Referral to Cardiac Rehabilitation  2. Essential hypertension  - metoprolol (LOPRESSOR) 50 MG tablet; Take 1 tablet (50 mg total) by mouth 2 (two) times daily.  Dispense: 180 tablet; Refill: 3  - POCT glycosylated hemoglobin (Hb A1C) - TSH - Urinalysis, Complete - PSA  3. Mixed hyperlipidemia  - rosuvastatin (CRESTOR) 20 MG tablet; Take 1 tablet (20 mg total) by mouth daily.  Dispense: 90 tablet; Refill: 3  4. Tobacco use  - buPROPion (WELLBUTRIN SR) 150 MG 12 hr tablet; Take 1 tablet (150 mg total) by mouth 2 (two) times daily.  Dispense: 60 tablet; Refill: 3  5. Insomnia  -  eszopiclone (LUNESTA) 2 MG TABS tablet; Take 1 tablet (2 mg total) by mouth at bedtime as needed for sleep. 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  Dispense: 30 tablet; Refill: 3 - indomethacin (INDOCIN) 25 MG capsule; Take 1 capsule (25 mg total) by mouth 2 (two) times daily with a meal.  Dispense: 30 capsule; Refill: 3   Patient has been counseled extensively about nutrition and exercise  Patient was counseled extensively on smoking cessation  Follow-up with cardiologist as scheduled  Return in about 3 months (around 04/02/2014), or if symptoms worsen or fail to improve, for Follow up HTN, Follow up Pain and comorbidities.  The patient was given clear instructions to go to ER or return to medical center if symptoms don't improve, worsen or new problems develop. The patient verbalized understanding. The patient was told to call to get lab results if they haven't heard anything in the next week.     This note has been created with Museum/gallery curator  and smart [employer] secretary. Any transcriptional errors are unintentional.    Angelica Chessman, MD, [place], [place], [place], [place]   12/31/2013, 3:41 PM

## 2013-12-31 NOTE — Progress Notes (Signed)
Pt is here to establish care. Pt recently had a heart attack. Pt has a history of gout, HTN and high cholesterol. Pt states that he has occasional pain behind his eye.

## 2013-12-31 NOTE — Patient Instructions (Signed)
DASH Eating Plan DASH stands for "Dietary Approaches to Stop Hypertension." The DASH eating plan is a healthy eating plan that has been shown to reduce high blood pressure (hypertension). Additional health benefits may include reducing the risk of type 2 diabetes mellitus, heart disease, and stroke. The DASH eating plan may also help with weight loss. WHAT DO I NEED TO KNOW ABOUT THE DASH EATING PLAN? For the DASH eating plan, you will follow these general guidelines:  Choose foods with a percent daily value for sodium of less than 5% (as listed on the food label).  Use salt-free seasonings or herbs instead of table salt or sea salt.  Check with your health care provider or pharmacist before using salt substitutes.  Eat lower-sodium products, often labeled as "lower sodium" or "no salt added."  Eat fresh foods.  Eat more vegetables, fruits, and low-fat dairy products.  Choose whole grains. Look for the word "whole" as the first word in the ingredient list.  Choose fish and skinless chicken or turkey more often than red meat. Limit fish, poultry, and meat to 6 oz (170 g) each day.  Limit sweets, desserts, sugars, and sugary drinks.  Choose heart-healthy fats.  Limit cheese to 1 oz (28 g) per day.  Eat more home-cooked food and less restaurant, buffet, and fast food.  Limit fried foods.  Cook foods using methods other than frying.  Limit canned vegetables. If you do use them, rinse them well to decrease the sodium.  When eating at a restaurant, ask that your food be prepared with less salt, or no salt if possible. WHAT FOODS CAN I EAT? Seek help from a dietitian for individual calorie needs. Grains Whole grain or whole wheat bread. Brown rice. Whole grain or whole wheat pasta. Quinoa, bulgur, and whole grain cereals. Low-sodium cereals. Corn or whole wheat flour tortillas. Whole grain cornbread. Whole grain crackers. Low-sodium crackers. Vegetables Fresh or frozen vegetables  (raw, steamed, roasted, or grilled). Low-sodium or reduced-sodium tomato and vegetable juices. Low-sodium or reduced-sodium tomato sauce and paste. Low-sodium or reduced-sodium canned vegetables.  Fruits All fresh, canned (in natural juice), or frozen fruits. Meat and Other Protein Products Ground beef (85% or leaner), grass-fed beef, or beef trimmed of fat. Skinless chicken or turkey. Ground chicken or turkey. Pork trimmed of fat. All fish and seafood. Eggs. Dried beans, peas, or lentils. Unsalted nuts and seeds. Unsalted canned beans. Dairy Low-fat dairy products, such as skim or 1% milk, 2% or reduced-fat cheeses, low-fat ricotta or cottage cheese, or plain low-fat yogurt. Low-sodium or reduced-sodium cheeses. Fats and Oils Tub margarines without trans fats. Light or reduced-fat mayonnaise and salad dressings (reduced sodium). Avocado. Safflower, olive, or canola oils. Natural peanut or almond butter. Other Unsalted popcorn and pretzels. The items listed above may not be a complete list of recommended foods or beverages. Contact your dietitian for more options. WHAT FOODS ARE NOT RECOMMENDED? Grains White bread. White pasta. White rice. Refined cornbread. Bagels and croissants. Crackers that contain trans fat. Vegetables Creamed or fried vegetables. Vegetables in a cheese sauce. Regular canned vegetables. Regular canned tomato sauce and paste. Regular tomato and vegetable juices. Fruits Dried fruits. Canned fruit in light or heavy syrup. Fruit juice. Meat and Other Protein Products Fatty cuts of meat. Ribs, chicken wings, bacon, sausage, bologna, salami, chitterlings, fatback, hot dogs, bratwurst, and packaged luncheon meats. Salted nuts and seeds. Canned beans with salt. Dairy Whole or 2% milk, cream, half-and-half, and cream cheese. Whole-fat or sweetened yogurt. Full-fat   cheeses or blue cheese. Nondairy creamers and whipped toppings. Processed cheese, cheese spreads, or cheese  curds. Condiments Onion and garlic salt, seasoned salt, table salt, and sea salt. Canned and packaged gravies. Worcestershire sauce. Tartar sauce. Barbecue sauce. Teriyaki sauce. Soy sauce, including reduced sodium. Steak sauce. Fish sauce. Oyster sauce. Cocktail sauce. Horseradish. Ketchup and mustard. Meat flavorings and tenderizers. Bouillon cubes. Hot sauce. Tabasco sauce. Marinades. Taco seasonings. Relishes. Fats and Oils Butter, stick margarine, lard, shortening, ghee, and bacon fat. Coconut, palm kernel, or palm oils. Regular salad dressings. Other Pickles and olives. Salted popcorn and pretzels. The items listed above may not be a complete list of foods and beverages to avoid. Contact your dietitian for more information. WHERE CAN I FIND MORE INFORMATION? [employer], [employer], [employer]: travelstabloid.com Document Released: 06/14/2011 Document Revised: 06/30/2013 Document Reviewed: 04/29/2013 [facility] Patient Information 2015 [place], [place]. This information is not intended to replace advice given to you by your health care provider. Make sure you discuss any questions you have with your health care provider. Hypertension Hypertension, commonly called high blood pressure, is when the force of blood pumping through your arteries is too strong. Your arteries are the blood vessels that carry blood from your heart throughout your body. A blood pressure reading consists of a higher number over a lower number, such as 110/72. The higher number (systolic) is the pressure inside your arteries when your heart pumps. The lower number (diastolic) is the pressure inside your arteries when your heart relaxes. Ideally you want your blood pressure below 120/80. Hypertension forces your heart to work harder to pump blood. Your arteries may become narrow or stiff. Having hypertension puts you at risk for heart disease, stroke, and other problems.  RISK  FACTORS Some risk factors for high blood pressure are controllable. Others are not.  Risk factors you cannot control include:   Race. You may be at higher risk if you are African American.  Age. Risk increases with age.  Gender. Men are at higher risk than women before age 46 years. After age 78, women are at higher risk than men. Risk factors you can control include:  Not getting enough exercise or physical activity.  Being overweight.  Getting too much fat, sugar, calories, or salt in your diet.  Drinking too much alcohol. SIGNS AND SYMPTOMS Hypertension does not usually cause signs or symptoms. Extremely high blood pressure (hypertensive crisis) may cause headache, anxiety, shortness of breath, and nosebleed. DIAGNOSIS  To check if you have hypertension, your health care provider will measure your blood pressure while you are seated, with your arm held at the level of your heart. It should be measured at least twice using the same arm. Certain conditions can cause a difference in blood pressure between your right and left arms. A blood pressure reading that is higher than normal on one occasion does not mean that you need treatment. If one blood pressure reading is high, ask your health care provider about having it checked again. TREATMENT  Treating high blood pressure includes making lifestyle changes and possibly taking medication. Living a healthy lifestyle can help lower high blood pressure. You may need to change some of your habits. Lifestyle changes may include:  Following the DASH diet. This diet is high in fruits, vegetables, and whole grains. It is low in salt, red meat, and added sugars.  Getting at least 2 1/2 hours of brisk physical activity every week.  Losing weight if necessary.  Not smoking.  Limiting alcoholic beverages.  Learning ways to reduce stress. If lifestyle changes are not enough to get your blood pressure under control, your health care provider  may prescribe medicine. You may need to take more than one. Work closely with your health care provider to understand the risks and benefits. HOME CARE INSTRUCTIONS  Have your blood pressure rechecked as directed by your health care provider.   Only take medicine as directed by your health care provider. Follow the directions carefully. Blood pressure medicines must be taken as prescribed. The medicine does not work as well when you skip doses. Skipping doses also puts you at risk for problems.   Do not smoke.   Monitor your blood pressure at home as directed by your health care provider. SEEK MEDICAL CARE IF:   You think you are having a reaction to medicines taken.  You have recurrent headaches or feel dizzy.  You have swelling in your ankles.  You have trouble with your vision. SEEK IMMEDIATE MEDICAL CARE IF:  You develop a severe headache or confusion.  You have unusual weakness, numbness, or feel faint.  You have severe chest or abdominal pain.  You vomit repeatedly.  You have trouble breathing. MAKE SURE YOU:   Understand these instructions.  Will watch your condition.  Will get help right away if you are not doing well or get worse. Document Released: 06/25/2005 Document Revised: 06/30/2013 Document Reviewed: 04/17/2013 [facility] Patient Information 2015 [place], [place]. This information is not intended to replace advice given to you by your health care provider. Make sure you discuss any questions you have with your health care provider. Coronary Artery Disease Coronary artery disease (CAD) is a process in which the heart (coronary) arteries narrow or become blocked from the development of atherosclerosis. Atherosclerosis is a disease in which plaque builds up on the inside of the heart arteries (coronary arteries). Plaque is made up of fats (lipids), cholesterol, calcium, and fibrous tissue. CAD can lead to a heart attack (myocardial infarction, MI). An MI can lead  to heart failure, cardiogenic shock, or sudden cardiac death. CAD can cause an MI through:  Plaque buildup that can severely narrow or block the coronary arteries and diminish blood flow.  Plaque that can become unstable and "rupture." Unstable plaque that ruptures within a coronary artery can form a clot and cause a sudden (acute) blockage. RISK FACTORS Many risk factors contribute to the development of CAD. These include:  High cholesterol (dyslipidemia) levels.  High blood pressure (hypertension).  Smoking.  Diabetes.  Age.  Gender. Men can develop CAD earlier in life than women.  Family history.  Inactivity or lack of regular physical or aerobic exercise.  A diet high in saturated fats.  Chronic kidney disease. SYMPTOMS  When a coronary artery is narrowed or blocked, an MI can occur. MI symptoms can include:  Chest pain (agina). Angina can occur by itself or it can also occur with pain in the neck, arm, jaw, or in the upper, middle back (mid-scapular pain).  Profuse sweating (diaphoresis) without physical activity or movement.  Shortness of breath (dyspnea).  Irregular heartbeats (palpitations) that feel like your heart is skipping beats or is beating very fast.  Nausea.  Epigastric pain. Epigastric pain may occur as "heartburn."  Tiredness (malaise). This can especially be present in the elderly. Women can have different (atypical) symptoms other than classic angina.  DIAGNOSIS  The diagnosis of CAD may include:  An electrocardiography (ECG). An ECG does not diagnose CAD,  but it is usefull in the detection of a sudden (acute) MI or as a marker for a previous MI. Depending on which heart (coronary) artery may be blocked, an ECG may not pick up an MI pattern.  Exercise stress test. A stress test can be performed at rest for people who are unable to do an exercise stress test. A stress test will only be abnormal if one or more of the large coronary arteries is  significantly blocked.  Blood tests. Tests may include samples to detect heart muscle damage (such as troponin levels). Other tests may include cholesterol checks and an inflammation test (high-sensitivity C-reactive protein, hs-CRP).  Coronary angiography.  Screening people who have peripheral vascular disease (PAD). These people often times have CAD. TREATMENT  The treatment of CAD includes the following:  Lifestyle changes such as:  Following a heart-healthy diet. A registered dietitian can you help educate you on healthy food options and changes.  Quiting smoking.  Following an exercise program approved by your caregiver.  Maintaining a healthy weight. Lose weight as approved by your caregiver.  Medicines to help control your blood pressure, cholesterol level, angina, and blood clotting. Medicines may include beta-blockers, ACE inhibitors, statins, nitrates, and anti-platelet medicines.  If you have a heart stent and are taking anti-platelet medicine, it is important to not suddenly stop taking this medicine. Suddenly stopping anti-platelet medicine can result in an MI. Talk with your caregiver before stopping medicine or if you cannot afford your medicine.  If the coronary arteries are significantly blocked, surgery may be needed. This can include:  Percutaneous coronary intervention (PCI) with or without stent placement.  Coronary artery bypass graft surgery (CABG). SEEK IMMEDIATE MEDICAL CARE IF:   You develop MI symptoms. This is a medical emergency. Get help at once. Call your local emergency service (911 in the [place] immediately. Do not drive yourself to the clinic or hospital. MI symptoms can include:  Angina or pain that occurs in the neck, arm, jaw, or in the upper middle back.  Profuse sweating without cause.  Shortness of breath or difficulty breathing without cause.  Unexplained nausea or epigastric pain that feels like heartburn. Document Released: 09/17/2011  Document Reviewed: 09/17/2011 [facility] Patient Information 2015 [place]. This information is not intended to replace advice given to you by your health care provider. Make sure you discuss any questions you have with your health care provider.

## 2014-01-01 LAB — URINALYSIS, COMPLETE
BILIRUBIN URINE: NEGATIVE
Bacteria, UA: NONE SEEN
CASTS: NONE SEEN
Crystals: NONE SEEN
Glucose, UA: NEGATIVE mg/dL
Ketones, ur: NEGATIVE mg/dL
Leukocytes, UA: NEGATIVE
Nitrite: NEGATIVE
PROTEIN: 30 mg/dL — AB
SPECIFIC GRAVITY, URINE: 1.022 (ref 1.005–1.030)
Squamous Epithelial / LPF: NONE SEEN
Urobilinogen, UA: 1 mg/dL (ref 0.0–1.0)
pH: 6.5 (ref 5.0–8.0)

## 2014-01-01 LAB — PSA: PSA: 3.99 ng/mL (ref <=4.00)

## 2014-01-01 LAB — TSH: TSH: 1.043 u[IU]/mL (ref 0.350–4.500)

## 2014-01-13 ENCOUNTER — Telehealth: Payer: Self-pay

## 2014-01-13 NOTE — Telephone Encounter (Signed)
Patient notified of lab results. Patient appreciative of information. Interested in [place]. Referral already in place. Voicemail left for Referral Coordinator.

## 2014-01-22 ENCOUNTER — Telehealth: Payer: Self-pay | Admitting: *Deleted

## 2014-01-22 NOTE — Telephone Encounter (Signed)
Pt is aware of his lab results.

## 2014-01-22 NOTE — Telephone Encounter (Signed)
Message copied by Joan Mayans on Fri Jan 22, 2014  2:28 PM ------      Message from: Angelica Chessman E      Created: Thu Jan 21, 2014  7:57 AM       Please inform patient that his lab results are mostly within normal limit including his prostate antigen ------

## 2014-04-01 ENCOUNTER — Ambulatory Visit: Payer: PRIVATE HEALTH INSURANCE | Admitting: Internal Medicine

## 2014-08-15 ENCOUNTER — Other Ambulatory Visit: Payer: Self-pay | Admitting: Internal Medicine

## 2014-10-14 ENCOUNTER — Other Ambulatory Visit: Payer: Self-pay | Admitting: Internal Medicine

## 2014-10-19 ENCOUNTER — Telehealth: Payer: Self-pay | Admitting: *Deleted

## 2014-10-19 NOTE — Telephone Encounter (Signed)
[facility] pharmacy called to refill patient's medicine.  I explained that the patient was last seen in June of 2015 and that he would need to see his provider before we could refill his medications

## 2015-02-08 ENCOUNTER — Encounter: Payer: Self-pay | Admitting: Internal Medicine

## 2015-02-21 ENCOUNTER — Ambulatory Visit: Payer: PRIVATE HEALTH INSURANCE | Attending: Internal Medicine | Admitting: Internal Medicine

## 2015-02-21 ENCOUNTER — Encounter: Payer: Self-pay | Admitting: Internal Medicine

## 2015-02-21 VITALS — BP 169/98 | HR 54 | Temp 97.9°F | Resp 18 | Ht 76.0 in | Wt 267.6 lb

## 2015-02-21 DIAGNOSIS — G4700 Insomnia, unspecified: Secondary | ICD-10-CM | POA: Insufficient documentation

## 2015-02-21 DIAGNOSIS — I10 Essential (primary) hypertension: Secondary | ICD-10-CM | POA: Diagnosis not present

## 2015-02-21 DIAGNOSIS — Z79899 Other long term (current) drug therapy: Secondary | ICD-10-CM | POA: Insufficient documentation

## 2015-02-21 DIAGNOSIS — E782 Mixed hyperlipidemia: Secondary | ICD-10-CM

## 2015-02-21 DIAGNOSIS — I252 Old myocardial infarction: Secondary | ICD-10-CM | POA: Insufficient documentation

## 2015-02-21 DIAGNOSIS — I2510 Atherosclerotic heart disease of native coronary artery without angina pectoris: Secondary | ICD-10-CM | POA: Insufficient documentation

## 2015-02-21 DIAGNOSIS — Z7902 Long term (current) use of antithrombotics/antiplatelets: Secondary | ICD-10-CM | POA: Insufficient documentation

## 2015-02-21 DIAGNOSIS — M10442 Other secondary gout, left hand: Secondary | ICD-10-CM | POA: Diagnosis not present

## 2015-02-21 DIAGNOSIS — Z720 Tobacco use: Secondary | ICD-10-CM | POA: Diagnosis not present

## 2015-02-21 DIAGNOSIS — F17210 Nicotine dependence, cigarettes, uncomplicated: Secondary | ICD-10-CM | POA: Insufficient documentation

## 2015-02-21 DIAGNOSIS — Z7982 Long term (current) use of aspirin: Secondary | ICD-10-CM | POA: Insufficient documentation

## 2015-02-21 DIAGNOSIS — M109 Gout, unspecified: Secondary | ICD-10-CM | POA: Insufficient documentation

## 2015-02-21 LAB — COMPLETE METABOLIC PANEL WITH GFR
ALK PHOS: 46 U/L (ref 40–115)
ALT: 13 U/L (ref 9–46)
AST: 13 U/L (ref 10–35)
Albumin: 3.9 g/dL (ref 3.6–5.1)
BUN: 19 mg/dL (ref 7–25)
CALCIUM: 9.5 mg/dL (ref 8.6–10.3)
CHLORIDE: 109 mmol/L (ref 98–110)
CO2: 25 mmol/L (ref 20–31)
Creat: 1.16 mg/dL (ref 0.70–1.33)
GFR, EST AFRICAN AMERICAN: 80 mL/min (ref >=60)
GFR, Est Non African American: 69 mL/min (ref >=60)
Glucose, Bld: 102 mg/dL — ABNORMAL HIGH (ref 65–99)
Potassium: 4.5 mmol/L (ref 3.5–5.3)
Sodium: 142 mmol/L (ref 135–146)
TOTAL PROTEIN: 6.6 g/dL (ref 6.1–8.1)
Total Bilirubin: 0.3 mg/dL (ref 0.2–1.2)

## 2015-02-21 LAB — CBC WITH DIFFERENTIAL/PLATELET
Basophils Absolute: 0 10*3/uL (ref 0.0–0.1)
Basophils Relative: 0 % (ref 0–1)
Eosinophils Absolute: 0.4 10*3/uL (ref 0.0–0.7)
Eosinophils Relative: 3 % (ref 0–5)
HEMATOCRIT: 48.7 % (ref 39.0–52.0)
Hemoglobin: 16.1 g/dL (ref 13.0–17.0)
LYMPHS ABS: 3.2 10*3/uL (ref 0.7–4.0)
LYMPHS PCT: 25 % (ref 12–46)
MCH: 30.3 pg (ref 26.0–34.0)
MCHC: 33.1 g/dL (ref 30.0–36.0)
MCV: 91.7 fL (ref 78.0–100.0)
MPV: 11.3 fL (ref 8.6–12.4)
Monocytes Absolute: 1.1 10*3/uL — ABNORMAL HIGH (ref 0.1–1.0)
Monocytes Relative: 9 % (ref 3–12)
Neutro Abs: 7.9 10*3/uL — ABNORMAL HIGH (ref 1.7–7.7)
Neutrophils Relative %: 63 % (ref 43–77)
Platelets: 284 10*3/uL (ref 150–400)
RBC: 5.31 MIL/uL (ref 4.22–5.81)
RDW: 13.5 % (ref 11.5–15.5)
WBC: 12.6 10*3/uL — ABNORMAL HIGH (ref 4.0–10.5)

## 2015-02-21 LAB — LIPID PANEL
CHOLESTEROL: 190 mg/dL (ref 125–200)
HDL: 31 mg/dL — ABNORMAL LOW (ref >=40)
LDL Cholesterol: 115 mg/dL (ref <130)
Total CHOL/HDL Ratio: 6.1 Ratio — ABNORMAL HIGH (ref <=5.0)
Triglycerides: 220 mg/dL — ABNORMAL HIGH (ref <150)
VLDL: 44 mg/dL — ABNORMAL HIGH (ref <30)

## 2015-02-21 LAB — TSH: TSH: 0.743 u[IU]/mL (ref 0.350–4.500)

## 2015-02-21 LAB — POCT GLYCOSYLATED HEMOGLOBIN (HGB A1C): Hemoglobin A1C: 5.9

## 2015-02-21 MED ORDER — FENOFIBRATE 160 MG PO TABS: 160.0000 mg | ORAL_TABLET | Freq: Every day | ORAL | Status: DC

## 2015-02-21 MED ORDER — CLOPIDOGREL BISULFATE 75 MG PO TABS: 75.0000 mg | ORAL_TABLET | Freq: Every day | ORAL | Status: DC

## 2015-02-21 MED ORDER — BUPROPION HCL ER (SR) 150 MG PO TB12: 150.0000 mg | ORAL_TABLET | Freq: Two times a day (BID) | ORAL | Status: DC

## 2015-02-21 MED ORDER — POTASSIUM CHLORIDE CRYS ER 20 MEQ PO TBCR: 20.0000 meq | EXTENDED_RELEASE_TABLET | Freq: Every day | ORAL | Status: DC

## 2015-02-21 MED ORDER — HYDROCHLOROTHIAZIDE 25 MG PO TABS: 25.0000 mg | ORAL_TABLET | Freq: Every day | ORAL | Status: DC

## 2015-02-21 MED ORDER — LISINOPRIL 40 MG PO TABS: 40.0000 mg | ORAL_TABLET | Freq: Every day | ORAL | Status: DC

## 2015-02-21 MED ORDER — ROSUVASTATIN CALCIUM 20 MG PO TABS: 20.0000 mg | ORAL_TABLET | Freq: Every day | ORAL | Status: DC

## 2015-02-21 MED ORDER — COLCHICINE 0.6 MG PO TABS: 0.6000 mg | ORAL_TABLET | Freq: Every day | ORAL | Status: DC

## 2015-02-21 MED ORDER — METOPROLOL TARTRATE 50 MG PO TABS: 50.0000 mg | ORAL_TABLET | Freq: Two times a day (BID) | ORAL | Status: DC

## 2015-02-21 MED ORDER — INDOMETHACIN 25 MG PO CAPS: 25.0000 mg | ORAL_CAPSULE | Freq: Three times a day (TID) | ORAL | Status: DC | PRN

## 2015-02-21 MED ORDER — ESZOPICLONE 3 MG PO TABS: 3.0000 mg | ORAL_TABLET | Freq: Every evening | ORAL | Status: DC | PRN

## 2015-02-21 NOTE — Patient Instructions (Signed)
Food Choices to Lower Your Triglycerides  Triglycerides are a type of fat in your blood. High levels of triglycerides can increase the risk of heart disease and stroke. If your triglyceride levels are high, the foods you eat and your eating habits are very important. Choosing the right foods can help lower your triglycerides.  WHAT GENERAL GUIDELINES DO I NEED TO FOLLOW?  Lose weight if you are overweight.   Limit or avoid alcohol.   Fill one half of your plate with vegetables and green salads.   Limit fruit to two servings a day. Choose fruit instead of juice.   Make one fourth of your plate whole grains. Look for the word "whole" as the first word in the ingredient list.  Fill one fourth of your plate with lean protein foods.  Enjoy fatty fish (such as salmon, mackerel, sardines, and tuna) three times a week.   Choose healthy fats.   Limit foods high in starch and sugar.  Eat more home-cooked food and less restaurant, buffet, and fast food.  Limit fried foods.  Cook foods using methods other than frying.  Limit saturated fats.  Check ingredient lists to avoid foods with partially hydrogenated oils (trans fats) in them. WHAT FOODS CAN I EAT?  Grains Whole grains, such as whole wheat or whole grain breads, crackers, cereals, and pasta. Unsweetened oatmeal, bulgur, barley, quinoa, or brown rice. Corn or whole wheat flour tortillas.  Vegetables Fresh or frozen vegetables (raw, steamed, roasted, or grilled). Green salads. Fruits All fresh, canned (in natural juice), or frozen fruits. Meat and Other Protein Products Ground beef (85% or leaner), grass-fed beef, or beef trimmed of fat. Skinless chicken or turkey. Ground chicken or turkey. Pork trimmed of fat. All fish and seafood. Eggs. Dried beans, peas, or lentils. Unsalted nuts or seeds. Unsalted canned or dry beans. Dairy Low-fat dairy products, such as skim or 1% milk, 2% or reduced-fat cheeses, low-fat ricotta or  cottage cheese, or plain low-fat yogurt. Fats and Oils Tub margarines without trans fats. Light or reduced-fat mayonnaise and salad dressings. Avocado. Safflower, olive, or canola oils. Natural peanut or almond butter. The items listed above may not be a complete list of recommended foods or beverages. Contact your dietitian for more options. WHAT FOODS ARE NOT RECOMMENDED?  Grains White bread. White pasta. White rice. Cornbread. Bagels, pastries, and croissants. Crackers that contain trans fat. Vegetables White potatoes. Corn. Creamed or fried vegetables. Vegetables in a cheese sauce. Fruits Dried fruits. Canned fruit in light or heavy syrup. Fruit juice. Meat and Other Protein Products Fatty cuts of meat. Ribs, chicken wings, bacon, sausage, bologna, salami, chitterlings, fatback, hot dogs, bratwurst, and packaged luncheon meats. Dairy Whole or 2% milk, cream, half-and-half, and cream cheese. Whole-fat or sweetened yogurt. Full-fat cheeses. Nondairy creamers and whipped toppings. Processed cheese, cheese spreads, or cheese curds. Sweets and Desserts Corn syrup, sugars, honey, and molasses. Candy. Jam and jelly. Syrup. Sweetened cereals. Cookies, pies, cakes, donuts, muffins, and ice cream. Fats and Oils Butter, stick margarine, lard, shortening, ghee, or bacon fat. Coconut, palm kernel, or palm oils. Beverages Alcohol. Sweetened drinks (such as sodas, lemonade, and fruit drinks or punches). The items listed above may not be a complete list of foods and beverages to avoid. Contact your dietitian for more information. Document Released: 04/12/2004 Document Revised: 06/30/2013 Document Reviewed: 04/29/2013 [employer] Patient Information 2015 [employer]. This information is not intended to replace advice given to you by your health care provider. Make sure you discuss   any questions you have with your health care provider. Smoking Cessation Quitting smoking is important to your health and  has many advantages. However, it is not always easy to quit since nicotine is a very addictive drug. Oftentimes, people try 3 times or more before being able to quit. This document explains the best ways for you to prepare to quit smoking. Quitting takes hard work and a lot of effort, but you can do it. ADVANTAGES OF QUITTING SMOKING  You will live longer, feel better, and live better.  Your body will feel the impact of quitting smoking almost immediately.  Within 20 minutes, blood pressure decreases. Your pulse returns to its normal level.  After 8 hours, carbon monoxide levels in the blood return to normal. Your oxygen level increases.  After 24 hours, the chance of having a heart attack starts to decrease. Your breath, hair, and body stop smelling like smoke.  After 48 hours, damaged nerve endings begin to recover. Your sense of taste and smell improve.  After 72 hours, the body is virtually free of nicotine. Your bronchial tubes relax and breathing becomes easier.  After 2 to 12 weeks, lungs can hold more air. Exercise becomes easier and circulation improves.  The risk of having a heart attack, stroke, cancer, or lung disease is greatly reduced.  After 1 year, the risk of coronary heart disease is cut in half.  After 5 years, the risk of stroke falls to the same as a nonsmoker.  After 10 years, the risk of lung cancer is cut in half and the risk of other cancers decreases significantly.  After 15 years, the risk of coronary heart disease drops, usually to the level of a nonsmoker.  If you are pregnant, quitting smoking will improve your chances of having a healthy baby.  The people you live with, especially any children, will be healthier.  You will have extra money to spend on things other than cigarettes. QUESTIONS TO THINK ABOUT BEFORE ATTEMPTING TO QUIT You may want to talk about your answers with your health care provider.  Why do you want to quit?  If you tried to  quit in the past, what helped and what did not?  What will be the most difficult situations for you after you quit? How will you plan to handle them?  Who can help you through the tough times? Your family? Friends? A health care provider?  What pleasures do you get from smoking? What ways can you still get pleasure if you quit? Here are some questions to ask your health care provider:  How can you help me to be successful at quitting?  What medicine do you think would be best for me and how should I take it?  What should I do if I need more help?  What is smoking withdrawal like? How can I get information on withdrawal? GET READY  Set a quit date.  Change your environment by getting rid of all cigarettes, ashtrays, matches, and lighters in your home, car, or work. Do not let people smoke in your home.  Review your past attempts to quit. Think about what worked and what did not. GET SUPPORT AND ENCOURAGEMENT You have a better chance of being successful if you have help. You can get support in many ways.  Tell your family, friends, and coworkers that you are going to quit and need their support. Ask them not to smoke around you.  Get individual, group, or telephone counseling and support.  Programs are available at [employer] and health centers. Call your local health department for information about programs in your area.  Spiritual beliefs and practices may help some smokers quit.  Download a "quit meter" on your computer to keep track of quit statistics, such as how long you have gone without smoking, cigarettes not smoked, and money saved.  Get a self-help book about quitting smoking and staying off tobacco. [place] yourself from urges to smoke. Talk to someone, go for a walk, or occupy your time with a task.  Change your normal routine. Take a different route to work. Drink tea instead of coffee. Eat breakfast in a different  place.  Reduce your stress. Take a hot bath, exercise, or read a book.  Plan something enjoyable to do every day. Reward yourself for not smoking.  Explore interactive web-based programs that specialize in helping you quit. GET MEDICINE AND USE IT CORRECTLY Medicines can help you stop smoking and decrease the urge to smoke. Combining medicine with the above behavioral methods and support can greatly increase your chances of successfully quitting smoking.  Nicotine replacement therapy helps deliver nicotine to your body without the negative effects and risks of smoking. Nicotine replacement therapy includes nicotine gum, lozenges, inhalers, nasal sprays, and skin patches. Some may be available over-the-counter and others require a prescription.  Antidepressant medicine helps people abstain from smoking, but how this works is unknown. This medicine is available by prescription.  Nicotinic receptor partial agonist medicine simulates the effect of nicotine in your brain. This medicine is available by prescription. Ask your health care provider for advice about which medicines to use and how to use them based on your health history. Your health care provider will tell you what side effects to look out for if you choose to be on a medicine or therapy. Carefully read the information on the package. Do not use any other product containing nicotine while using a nicotine replacement product.  RELAPSE OR DIFFICULT SITUATIONS Most relapses occur within the first 3 months after quitting. Do not be discouraged if you start smoking again. Remember, most people try several times before finally quitting. You may have symptoms of withdrawal because your body is used to nicotine. You may crave cigarettes, be irritable, feel very hungry, cough often, get headaches, or have difficulty concentrating. The withdrawal symptoms are only temporary. They are strongest when you first quit, but they will go away within 10-14  days. To reduce the chances of relapse, try to:  Avoid drinking alcohol. Drinking lowers your chances of successfully quitting.  Reduce the amount of caffeine you consume. Once you quit smoking, the amount of caffeine in your body increases and can give you symptoms, such as a rapid heartbeat, sweating, and anxiety.  Avoid smokers because they can make you want to smoke.  Do not let weight gain distract you. Many smokers will gain weight when they quit, usually less than 10 pounds. Eat a healthy diet and stay active. You can always lose the weight gained after you quit.  Find ways to improve your mood other than smoking. FOR MORE INFORMATION  www.smokefree.gov  Document Released: 06/19/2001 Document Revised: 11/09/2013 Document Reviewed: 10/04/2011 [facility] Patient Information 2015 [place], [place]. This information is not intended to replace advice given to you by your health care provider. Make sure you discuss any questions you have with your health care provider. DASH Eating Plan DASH stands for "Dietary Approaches to Stop Hypertension." The  DASH eating plan is a healthy eating plan that has been shown to reduce high blood pressure (hypertension). Additional health benefits may include reducing the risk of type 2 diabetes mellitus, heart disease, and stroke. The DASH eating plan may also help with weight loss. WHAT DO I NEED TO KNOW ABOUT THE DASH EATING PLAN? For the DASH eating plan, you will follow these general guidelines:  Choose foods with a percent daily value for sodium of less than 5% (as listed on the food label).  Use salt-free seasonings or herbs instead of table salt or sea salt.  Check with your health care provider or pharmacist before using salt substitutes.  Eat lower-sodium products, often labeled as "lower sodium" or "no salt added."  Eat fresh foods.  Eat more vegetables, fruits, and low-fat dairy products.  Choose whole grains. Look for the word "whole" as the  first word in the ingredient list.  Choose fish and skinless chicken or [place] more often than red meat. Limit fish, poultry, and meat to 6 oz (170 g) each day.  Limit sweets, desserts, sugars, and sugary drinks.  Choose heart-healthy fats.  Limit cheese to 1 oz (28 g) per day.  Eat more home-cooked food and less restaurant, buffet, and fast food.  Limit fried foods.  Cook foods using methods other than frying.  Limit canned vegetables. If you do use them, rinse them well to decrease the sodium.  When eating at a restaurant, ask that your food be prepared with less salt, or no salt if possible. WHAT FOODS CAN I EAT? Seek help from a dietitian for individual calorie needs. Grains Whole grain or whole wheat bread. Brown rice. Whole grain or whole wheat pasta. Quinoa, bulgur, and whole grain cereals. Low-sodium cereals. Corn or whole wheat flour tortillas. Whole grain cornbread. Whole grain crackers. Low-sodium crackers. Vegetables Fresh or frozen vegetables (raw, steamed, roasted, or grilled). Low-sodium or reduced-sodium tomato and vegetable juices. Low-sodium or reduced-sodium tomato sauce and paste. Low-sodium or reduced-sodium canned vegetables.  Fruits All fresh, canned (in natural juice), or frozen fruits. Meat and Other Protein Products Ground beef (85% or leaner), grass-fed beef, or beef trimmed of fat. Skinless chicken or [place]. Ground chicken or [place]. Pork trimmed of fat. All fish and seafood. Eggs. Dried beans, peas, or lentils. Unsalted nuts and seeds. Unsalted canned beans. Dairy Low-fat dairy products, such as skim or 1% milk, 2% or reduced-fat cheeses, low-fat ricotta or cottage cheese, or plain low-fat yogurt. Low-sodium or reduced-sodium cheeses. Fats and Oils Tub margarines without trans fats. Light or reduced-fat mayonnaise and salad dressings (reduced sodium). Avocado. Safflower, olive, or canola oils. Natural peanut or almond butter. Other Unsalted popcorn and  pretzels. The items listed above may not be a complete list of recommended foods or beverages. Contact your dietitian for more options. WHAT FOODS ARE NOT RECOMMENDED? Grains White bread. White pasta. White rice. Refined cornbread. Bagels and croissants. Crackers that contain trans fat. Vegetables Creamed or fried vegetables. Vegetables in a cheese sauce. Regular canned vegetables. Regular canned tomato sauce and paste. Regular tomato and vegetable juices. Fruits Dried fruits. Canned fruit in light or heavy syrup. Fruit juice. Meat and Other Protein Products Fatty cuts of meat. Ribs, chicken wings, bacon, sausage, bologna, salami, chitterlings, fatback, hot dogs, bratwurst, and packaged luncheon meats. Salted nuts and seeds. Canned beans with salt. Dairy Whole or 2% milk, cream, half-and-half, and cream cheese. Whole-fat or sweetened yogurt. Full-fat cheeses or blue cheese. Nondairy creamers and whipped toppings. Processed cheese,  cheese spreads, or cheese curds. Condiments Onion and garlic salt, seasoned salt, table salt, and sea salt. Canned and packaged gravies. Worcestershire sauce. Tartar sauce. Barbecue sauce. Teriyaki sauce. Soy sauce, including reduced sodium. Steak sauce. Fish sauce. Oyster sauce. Cocktail sauce. Horseradish. Ketchup and mustard. Meat flavorings and tenderizers. Bouillon cubes. Hot sauce. Tabasco sauce. Marinades. Taco seasonings. Relishes. Fats and Oils Butter, stick margarine, lard, shortening, ghee, and bacon fat. Coconut, palm kernel, or palm oils. Regular salad dressings. Other Pickles and olives. Salted popcorn and pretzels. The items listed above may not be a complete list of foods and beverages to avoid. Contact your dietitian for more information. WHERE CAN I FIND MORE INFORMATION? National Heart, Lung, and Blood Institute: travelstabloid.com Document Released: 06/14/2011 Document Revised: 11/09/2013 Document Reviewed:  04/29/2013 [facility] Patient Information 2015 [place], [place]. This information is not intended to replace advice given to you by your health care provider. Make sure you discuss any questions you have with your health care provider. Hypertension Hypertension, commonly called high blood pressure, is when the force of blood pumping through your arteries is too strong. Your arteries are the blood vessels that carry blood from your heart throughout your body. A blood pressure reading consists of a higher number over a lower number, such as 110/72. The higher number (systolic) is the pressure inside your arteries when your heart pumps. The lower number (diastolic) is the pressure inside your arteries when your heart relaxes. Ideally you want your blood pressure below 120/80. Hypertension forces your heart to work harder to pump blood. Your arteries may become narrow or stiff. Having hypertension puts you at risk for heart disease, stroke, and other problems.  RISK FACTORS Some risk factors for high blood pressure are controllable. Others are not.  Risk factors you cannot control include:   Race. You may be at higher risk if you are African American.  Age. Risk increases with age.  Gender. Men are at higher risk than women before age 45 years. After age 54, women are at higher risk than men. Risk factors you can control include:  Not getting enough exercise or physical activity.  Being overweight.  Getting too much fat, sugar, calories, or salt in your diet.  Drinking too much alcohol. SIGNS AND SYMPTOMS Hypertension does not usually cause signs or symptoms. Extremely high blood pressure (hypertensive crisis) may cause headache, anxiety, shortness of breath, and nosebleed. DIAGNOSIS  To check if you have hypertension, your health care provider will measure your blood pressure while you are seated, with your arm held at the level of your heart. It should be measured at least twice using the same  arm. Certain conditions can cause a difference in blood pressure between your right and left arms. A blood pressure reading that is higher than normal on one occasion does not mean that you need treatment. If one blood pressure reading is high, ask your health care provider about having it checked again. TREATMENT  Treating high blood pressure includes making lifestyle changes and possibly taking medicine. Living a healthy lifestyle can help lower high blood pressure. You may need to change some of your habits. Lifestyle changes may include:  Following the DASH diet. This diet is high in fruits, vegetables, and whole grains. It is low in salt, red meat, and added sugars.  Getting at least 2 hours of brisk physical activity every week.  Losing weight if necessary.  Not smoking.  Limiting alcoholic beverages.  Learning ways to reduce stress. If lifestyle changes  are not enough to get your blood pressure under control, your health care provider may prescribe medicine. You may need to take more than one. Work closely with your health care provider to understand the risks and benefits. HOME CARE INSTRUCTIONS  Have your blood pressure rechecked as directed by your health care provider.   Take medicines only as directed by your health care provider. Follow the directions carefully. Blood pressure medicines must be taken as prescribed. The medicine does not work as well when you skip doses. Skipping doses also puts you at risk for problems.   Do not smoke.   Monitor your blood pressure at home as directed by your health care provider. SEEK MEDICAL CARE IF:   You think you are having a reaction to medicines taken.  You have recurrent headaches or feel dizzy.  You have swelling in your ankles.  You have trouble with your vision. SEEK IMMEDIATE MEDICAL CARE IF:  You develop a severe headache or confusion.  You have unusual weakness, numbness, or feel faint.  You have severe chest  or abdominal pain.  You vomit repeatedly.  You have trouble breathing. MAKE SURE YOU:   Understand these instructions.  Will watch your condition.  Will get help right away if you are not doing well or get worse. Document Released: 06/25/2005 Document Revised: 11/09/2013 Document Reviewed: 04/17/2013 [facility] Patient Information 2015 [place], [place]. This information is not intended to replace advice given to you by your health care provider. Make sure you discuss any questions you have with your health care provider.

## 2015-02-21 NOTE — Progress Notes (Signed)
Patient here for check up and medication refill. Patient denies any pain today. Patient has taken his medications today and needs refills on all his medications. Patient request a stronger dose of Lunesta. Patient reports throughout the night he sweats profusely to the point his pillow is soaked and he is unsure why this is occurring. Patient reports it happens at least 3 nights out of the week.

## 2015-02-21 NOTE — Progress Notes (Signed)
Patient ID: [name], male   DOB: 09-21-1955, 59 y.o.   MRN: [id number]   [name], is a 59 y.o. male  [id number]  CNO:[id number]  DOB - July 31, 1955  Chief Complaint  Patient presents with  . Medication Refill        Subjective:   [name] is a 59 y.o. male here today for a follow up visit. Patient has history of hypertension, gout, hyperlipidemia, coronary artery disease, myocardial infarction in May 2015 and obesity. He has been lost to follow-up since June 2015 but here today for medication refills. His major complaint today is uncontrolled hypertension despite compliance with medications. Patient says he checks his blood pressure at home and the systolic blood pressures mostly around 294 and diastolic above 90 most of the time. He also complained of excessive sweatiness especially at night with poor sleep followed by excessive tiredness and fatigue during the day. He is to continue to smoke cigarettes about 1 pack per day, although he says Wellbutrin is helping him both with depression as well as trying to quit smoking. Patient is requesting a stronger Lunesta for sleeping. He needs a refill of all his medications today. He has not had any follow-up with cardiologist since his MI. He has not had any echocardiogram either. Patient has No headache, No chest pain, No abdominal pain - No Nausea, No new weakness tingling or numbness, No Cough - SOB.  Problem  Other Secondary Gout of Left Hand    ALLERGIES: No Known Allergies  PAST MEDICAL HISTORY: Past Medical History  Diagnosis Date  . Hypertension   . Gout   . Coronary artery disease   . High cholesterol     MEDICATIONS AT HOME: Prior to Admission medications   Medication Sig Start Date End Date Taking? Authorizing Provider  aspirin 325 MG tablet Take 325 mg by mouth daily.   Yes Historical Provider, MD  buPROPion (WELLBUTRIN SR) 150 MG 12 hr tablet Take 1 tablet (150 mg total) by mouth 2 (two) times daily.  02/21/15  Yes Tresa Garter, MD  clopidogrel (PLAVIX) 75 MG tablet Take 1 tablet (75 mg total) by mouth daily with breakfast. 02/21/15  Yes Tresa Garter, MD  colchicine 0.6 MG tablet Take 1 tablet (0.6 mg total) by mouth daily. 02/21/15  Yes Tresa Garter, MD  Eszopiclone 3 MG TABS Take 1 tablet (3 mg total) by mouth at bedtime as needed for sleep. Take immediately before bedtime 02/21/15  Yes Tresa Garter, MD  fenofibrate 160 MG tablet Take 1 tablet (160 mg total) by mouth daily. 02/21/15  Yes Tresa Garter, MD  indomethacin (INDOCIN) 25 MG capsule Take 1 capsule (25 mg total) by mouth 3 (three) times daily as needed. 02/21/15  Yes Tresa Garter, MD  lisinopril (PRINIVIL,ZESTRIL) 40 MG tablet Take 1 tablet (40 mg total) by mouth daily. 02/21/15  Yes Tresa Garter, MD  metoprolol (LOPRESSOR) 50 MG tablet Take 1 tablet (50 mg total) by mouth 2 (two) times daily. 02/21/15  Yes Tresa Garter, MD  potassium chloride SA (K-DUR,KLOR-CON) 20 MEQ tablet Take 1 tablet (20 mEq total) by mouth daily. 02/21/15  Yes Tresa Garter, MD  rosuvastatin (CRESTOR) 20 MG tablet Take 1 tablet (20 mg total) by mouth daily. 02/21/15  Yes Tresa Garter, MD  hydrochlorothiazide (HYDRODIURIL) 25 MG tablet Take 1 tablet (25 mg total) by mouth daily. 02/21/15   Tresa Garter, MD     Objective:  Filed Vitals:   02/21/15 0927  BP: 169/98  Pulse: 54  Temp: 97.9 F (36.6 C)  TempSrc: Oral  Resp: 18  Height: '6\' 4"'$  (1.93 m)  Weight: 267 lb 9.6 oz (121.383 kg)  SpO2: 98%    Exam General appearance : Awake, alert, not in any distress. Speech Clear. Not toxic looking, obese HEENT: Atraumatic and Normocephalic, pupils equally reactive to light and accomodation Neck: supple, no JVD. No cervical lymphadenopathy.  Chest:Good air entry bilaterally, no added sounds  CVS: S1 S2 regular, no murmurs.  Abdomen: Bowel sounds present, Non tender and not distended with no  gaurding, rigidity or rebound. Extremities: B/L Lower Ext shows no edema, both legs are warm to touch Neurology: Awake alert, and oriented X 3, CN II-XII intact, Non focal Skin:No Rash  Data Review Lab Results  Component Value Date   HGBA1C 5.6 12/31/2013     Assessment & Plan   1. Tobacco use  - buPROPion (WELLBUTRIN SR) 150 MG 12 hr tablet; Take 1 tablet (150 mg total) by mouth 2 (two) times daily.  Dispense: 180 tablet; Refill: 3  The patient was counseled on the dangers of tobacco use, and was advised to quit. Reviewed strategies to maximize success, including removing cigarettes and smoking materials from environment, stress management and support of family/friends.   2. Arteriosclerotic coronary artery disease  - clopidogrel (PLAVIX) 75 MG tablet; Take 1 tablet (75 mg total) by mouth daily with breakfast.  Dispense: 90 tablet; Refill: 3 - fenofibrate 160 MG tablet; Take 1 tablet (160 mg total) by mouth daily.  Dispense: 90 tablet; Refill: 3 - lisinopril (PRINIVIL,ZESTRIL) 40 MG tablet; Take 1 tablet (40 mg total) by mouth daily.  Dispense: 90 tablet; Refill: 3 - Echocardiogram; Future  3. Other secondary gout of left hand  - colchicine 0.6 MG tablet; Take 1 tablet (0.6 mg total) by mouth daily.  Dispense: 30 tablet; Refill: 3 - indomethacin (INDOCIN) 25 MG capsule; Take 1 capsule (25 mg total) by mouth 3 (three) times daily as needed.  Dispense: 30 capsule; Refill: 2  4. Insomnia  Increase - Eszopiclone 3 MG TABS; Take 1 tablet (3 mg total) by mouth at bedtime as needed for sleep. Take immediately before bedtime  Dispense: 90 tablet; Refill: 3 - TSH  5. Essential hypertension: Uncontrolled  - metoprolol (LOPRESSOR) 50 MG tablet; Take 1 tablet (50 mg total) by mouth 2 (two) times daily.  Dispense: 180 tablet; Refill: 3 - potassium chloride SA (K-DUR,KLOR-CON) 20 MEQ tablet; Take 1 tablet (20 mEq total) by mouth daily.  Dispense: 30 tablet; Refill: 3 Add -  hydrochlorothiazide (HYDRODIURIL) 25 MG tablet; Take 1 tablet (25 mg total) by mouth daily.  Dispense: 90 tablet; Refill: 3  - CBC with Differential/Platelet - COMPLETE METABOLIC PANEL WITH GFR - POCT glycosylated hemoglobin (Hb A1C) - Urinalysis, Complete - Echocardiogram; Future  - We have discussed target BP range and blood pressure goal - I have advised patient to check BP regularly and to call us back or report to clinic if the numbers are consistently higher than 140/90  - We discussed the importance of compliance with medical therapy and DASH diet recommended, consequences of uncontrolled hypertension discussed.  - continue current BP medications  6. Mixed hyperlipidemia  - rosuvastatin (CRESTOR) 20 MG tablet; Take 1 tablet (20 mg total) by mouth daily.  Dispense: 90 tablet; Refill: 3 - Lipid panel  To address this please limit saturated fat to no more than 7% of your calories,  limit cholesterol to 200 mg/day, increase fiber and exercise as tolerated. If needed we may add another cholesterol lowering medication to your regimen.   Patient have been counseled extensively about nutrition and exercise   Return in about 3 months (around 05/24/2015), or if symptoms worsen or fail to improve, for Follow up HTN, Routine Follow Up.  The patient was given clear instructions to go to ER or return to medical center if symptoms don't improve, worsen or new problems develop. The patient verbalized understanding. The patient was told to call to get lab results if they haven't heard anything in the next week.   This note has been created with Surveyor, quantity. Any transcriptional errors are unintentional.    Angelica Chessman, MD, [place], [place], [place], [place] and [facility] [place], [place]   02/21/2015, 10:05 AM

## 2015-02-22 LAB — URINALYSIS, COMPLETE
Bacteria, UA: NONE SEEN [HPF]
Bilirubin Urine: NEGATIVE
Casts: NONE SEEN [LPF]
GLUCOSE, UA: NEGATIVE
KETONES UR: NEGATIVE
Nitrite: NEGATIVE
PH: 5.5 (ref 5.0–8.0)
Specific Gravity, Urine: 1.026 (ref 1.001–1.035)
Yeast: NONE SEEN [HPF]

## 2015-02-24 ENCOUNTER — Other Ambulatory Visit: Payer: Self-pay | Admitting: Internal Medicine

## 2015-03-02 ENCOUNTER — Ambulatory Visit (HOSPITAL_COMMUNITY): Payer: PRIVATE HEALTH INSURANCE

## 2015-03-10 ENCOUNTER — Telehealth: Payer: Self-pay

## 2015-03-10 NOTE — Telephone Encounter (Signed)
Nurse called patient, patient verified date of birth. Patient aware of lab results being mostly normal, except triglycerides are slightly high but showed improvement from previous results. Patient aware of urine showing calcium crystals. Patient reports drinking a lot of water throughout the day. Pateint agrees to monitor and will not make any changes in current management.  Patient was scheduled to have echo but canceled due to having to pay $300-$400 with insurance. Patient voices understanding and has no further questions at this time.

## 2015-03-10 NOTE — Telephone Encounter (Signed)
-----   Message from Nila Nephew, RN sent at 03/02/2015  9:34 AM EDT -----   ----- Message -----    From: Tresa Garter, MD    Sent: 02/25/2015   5:46 PM      To: Esperanza Sheets, RN  Please inform patient that his laboratory test results are mostly normal, triglyceride is still slightly high but showed improvement from previous results, however his urine showed calcium crystal. Advised patient on liberal oral liquid/water intake, we will monitor. No change in current management.

## 2015-09-08 ENCOUNTER — Other Ambulatory Visit: Payer: Self-pay | Admitting: Internal Medicine

## 2015-09-16 ENCOUNTER — Other Ambulatory Visit: Payer: Self-pay | Admitting: Internal Medicine

## 2015-12-09 ENCOUNTER — Other Ambulatory Visit: Payer: Self-pay | Admitting: Internal Medicine

## 2016-03-04 ENCOUNTER — Other Ambulatory Visit: Payer: Self-pay | Admitting: Internal Medicine

## 2016-03-04 DIAGNOSIS — I2510 Atherosclerotic heart disease of native coronary artery without angina pectoris: Secondary | ICD-10-CM

## 2016-03-04 DIAGNOSIS — E782 Mixed hyperlipidemia: Secondary | ICD-10-CM

## 2016-03-04 DIAGNOSIS — Z720 Tobacco use: Secondary | ICD-10-CM

## 2016-03-04 DIAGNOSIS — I10 Essential (primary) hypertension: Secondary | ICD-10-CM

## 2016-04-04 ENCOUNTER — Telehealth: Payer: Self-pay | Admitting: Internal Medicine

## 2016-04-04 DIAGNOSIS — I10 Essential (primary) hypertension: Secondary | ICD-10-CM

## 2016-04-04 DIAGNOSIS — I2510 Atherosclerotic heart disease of native coronary artery without angina pectoris: Secondary | ICD-10-CM

## 2016-04-04 NOTE — Telephone Encounter (Signed)
Since patient has not been seen since August 2016, will forward request to Dr. Doreene Burke to determine if 90 day supply is appropriate. Appt scheduled for 04/12/2016.

## 2016-04-04 NOTE — Telephone Encounter (Signed)
Patient called to schedule appt as well as request refills for clopidogrel (PLAVIX) 75 MG tablet, Eszopiclone 3 MG TABS, fenofibrate 160 MG tablet, hydrochlorothiazide (HYDRODIURIL) 25 MG tablet, and metoprolol (LOPRESSOR) 50 MG tablet. Patient stated that if a 90 supply can't be called in to please not fill meds. He is aware that hasn't been here in a year and therefore schedule an appt.  Please follow up.  Thank you

## 2016-04-05 NOTE — Telephone Encounter (Signed)
Will refill for 90 days during his office visit. If refill needed urgently, approve for one week only to cover till OV

## 2016-04-06 MED ORDER — HYDROCHLOROTHIAZIDE 25 MG PO TABS: 25.0000 mg | ORAL_TABLET | Freq: Every day | ORAL | 0 refills | Status: DC

## 2016-04-06 MED ORDER — METOPROLOL TARTRATE 50 MG PO TABS: 50.0000 mg | ORAL_TABLET | Freq: Two times a day (BID) | ORAL | 0 refills | Status: DC

## 2016-04-06 MED ORDER — CLOPIDOGREL BISULFATE 75 MG PO TABS: 75.0000 mg | ORAL_TABLET | Freq: Every day | ORAL | 0 refills | Status: DC

## 2016-04-06 MED ORDER — FENOFIBRATE 160 MG PO TABS: 160.0000 mg | ORAL_TABLET | Freq: Every day | ORAL | 0 refills | Status: DC

## 2016-04-06 NOTE — Telephone Encounter (Signed)
Refilled requested medications x 1 week except for eszopiclone - patient will have to have office visit for that.

## 2016-04-12 ENCOUNTER — Encounter: Payer: Self-pay | Admitting: Internal Medicine

## 2016-04-12 ENCOUNTER — Ambulatory Visit: Payer: No Typology Code available for payment source | Attending: Internal Medicine | Admitting: Internal Medicine

## 2016-04-12 ENCOUNTER — Other Ambulatory Visit: Payer: Self-pay

## 2016-04-12 VITALS — BP 151/76 | HR 81 | Temp 98.2°F | Resp 18 | Ht 76.0 in | Wt 267.2 lb

## 2016-04-12 DIAGNOSIS — I2510 Atherosclerotic heart disease of native coronary artery without angina pectoris: Secondary | ICD-10-CM | POA: Diagnosis present

## 2016-04-12 DIAGNOSIS — R350 Frequency of micturition: Secondary | ICD-10-CM | POA: Insufficient documentation

## 2016-04-12 DIAGNOSIS — N401 Enlarged prostate with lower urinary tract symptoms: Secondary | ICD-10-CM | POA: Insufficient documentation

## 2016-04-12 DIAGNOSIS — Z720 Tobacco use: Secondary | ICD-10-CM

## 2016-04-12 DIAGNOSIS — F17200 Nicotine dependence, unspecified, uncomplicated: Secondary | ICD-10-CM | POA: Insufficient documentation

## 2016-04-12 DIAGNOSIS — E782 Mixed hyperlipidemia: Secondary | ICD-10-CM

## 2016-04-12 DIAGNOSIS — I10 Essential (primary) hypertension: Secondary | ICD-10-CM | POA: Diagnosis present

## 2016-04-12 LAB — PSA: PSA: 4.7 ng/mL — ABNORMAL HIGH (ref <=4.0)

## 2016-04-12 LAB — TSH: TSH: 0.8 mIU/L (ref 0.40–4.50)

## 2016-04-12 MED ORDER — TAMSULOSIN HCL 0.4 MG PO CAPS: 0.4000 mg | ORAL_CAPSULE | Freq: Every day | ORAL | 3 refills | Status: DC

## 2016-04-12 MED ORDER — ESZOPICLONE 3 MG PO TABS: ORAL_TABLET | ORAL | 3 refills | Status: DC

## 2016-04-12 MED ORDER — HYDROCHLOROTHIAZIDE 25 MG PO TABS: 25.0000 mg | ORAL_TABLET | Freq: Every day | ORAL | 3 refills | Status: DC

## 2016-04-12 MED ORDER — FENOFIBRATE 160 MG PO TABS: 160.0000 mg | ORAL_TABLET | Freq: Every day | ORAL | 3 refills | Status: DC

## 2016-04-12 MED ORDER — LISINOPRIL 40 MG PO TABS: 40.0000 mg | ORAL_TABLET | Freq: Every day | ORAL | 3 refills | Status: DC

## 2016-04-12 MED ORDER — POTASSIUM CHLORIDE CRYS ER 20 MEQ PO TBCR: 20.0000 meq | EXTENDED_RELEASE_TABLET | Freq: Every day | ORAL | 3 refills | Status: DC

## 2016-04-12 MED ORDER — ROSUVASTATIN CALCIUM 20 MG PO TABS: 20.0000 mg | ORAL_TABLET | Freq: Every day | ORAL | 3 refills | Status: DC

## 2016-04-12 MED ORDER — CLOPIDOGREL BISULFATE 75 MG PO TABS: 75.0000 mg | ORAL_TABLET | Freq: Every day | ORAL | 3 refills | Status: DC

## 2016-04-12 MED ORDER — METOPROLOL TARTRATE 50 MG PO TABS: 50.0000 mg | ORAL_TABLET | Freq: Two times a day (BID) | ORAL | 0 refills | Status: DC

## 2016-04-12 MED ORDER — BUPROPION HCL ER (SR) 150 MG PO TB12: 150.0000 mg | ORAL_TABLET | Freq: Two times a day (BID) | ORAL | 3 refills | Status: DC

## 2016-04-12 MED ORDER — COLCHICINE 0.6 MG PO TABS: 0.6000 mg | ORAL_TABLET | Freq: Every day | ORAL | 3 refills | Status: DC

## 2016-04-12 NOTE — Progress Notes (Signed)
[name], is a 60 y.o. male  [id number]  [id number]  DOB - 08-18-1955  Chief Complaint  Patient presents with  . Medication Refill      Subjective:   [name] is a 60 y.o. male here today for a follow up visit. Patient has medical history of hypertension, gout, hyperlipidemia, coronary artery disease, myocardial infarction in May 2015 and obesity. He has been lost to follow-up since August 2016 but here today for medication refills and Lab tests. His major complaint today is uncontrolled hypertension despite compliance with medications. He has no pain. Reports no side effect to medications. He continues to smoke cigarettes about 1 pack per day, although he says Wellbutrin is helping him both with depression as well as trying to quit smoking. Patient has No headache, No chest pain, No abdominal pain - No Nausea, No new weakness tingling or numbness, No Cough - SOB. Patient has very poor follow up with specialist, PCP and even procedures ordered. He is yet to do the ECHO ordered in 2016 to follow up his MI, he is yet to follow up with Cardiologist since MI. Patient claims he is doing ok.  ALLERGIES: No Known Allergies  PAST MEDICAL HISTORY: Past Medical History:  Diagnosis Date  . Coronary artery disease   . Gout   . High cholesterol   . Hypertension     MEDICATIONS AT HOME: Prior to Admission medications   Medication Sig Start Date End Date Taking? Authorizing Provider  aspirin 325 MG tablet Take 325 mg by mouth daily.   Yes Historical Provider, MD  buPROPion (WELLBUTRIN SR) 150 MG 12 hr tablet Take 1 tablet (150 mg total) by mouth 2 (two) times daily. 04/12/16  Yes Tresa Garter, MD  clopidogrel (PLAVIX) 75 MG tablet Take 1 tablet (75 mg total) by mouth daily with breakfast. 04/12/16  Yes Tresa Garter, MD  colchicine 0.6 MG tablet Take 1 tablet (0.6 mg total) by mouth daily. 04/12/16  Yes Jyrah Blye E Anyelo Mccue, MD  Eszopiclone 3 MG TABS TAKE 1 TABLET BY  MOUTH AT BEDTIME AS NEEDED SLEEP. TAKE IMMEDIATELY BEFORE BEDTIME 04/12/16  Yes Tresa Garter, MD  fenofibrate 160 MG tablet Take 1 tablet (160 mg total) by mouth daily. 04/12/16  Yes Tresa Garter, MD  hydrochlorothiazide (HYDRODIURIL) 25 MG tablet Take 1 tablet (25 mg total) by mouth daily. 04/12/16  Yes Tresa Garter, MD  indomethacin (INDOCIN) 25 MG capsule TAKE 1 CAPSULE BY MOUTH 3 TIMES DAILY AS NEEDED. 09/12/15  Yes Tresa Garter, MD  lisinopril (PRINIVIL,ZESTRIL) 40 MG tablet Take 1 tablet (40 mg total) by mouth daily. 04/12/16  Yes Tresa Garter, MD  metoprolol (LOPRESSOR) 50 MG tablet Take 1 tablet (50 mg total) by mouth 2 (two) times daily. 04/12/16  Yes Tresa Garter, MD  potassium chloride SA (K-DUR,KLOR-CON) 20 MEQ tablet Take 1 tablet (20 mEq total) by mouth daily. 04/12/16  Yes Tresa Garter, MD  rosuvastatin (CRESTOR) 20 MG tablet Take 1 tablet (20 mg total) by mouth daily. 04/12/16  Yes Tresa Garter, MD    Objective:   Vitals:   04/12/16 1643  BP: (!) 151/76  Pulse: 81  Resp: 18  Temp: 98.2 F (36.8 C)  TempSrc: Oral  SpO2: 97%  Weight: 267 lb 3.2 oz (121.2 kg)  Height: '6\' 4"'$  (1.93 m)   Exam General appearance : Awake, alert, not in any distress. Speech Clear. Not toxic looking HEENT: Atraumatic and Normocephalic, pupils  equally reactive to light and accomodation Neck: Supple, no JVD. No cervical lymphadenopathy.  Chest: Good air entry bilaterally, no added sounds  CVS: S1 S2 irregular, + ectopic beat, no murmurs.  Abdomen: Bowel sounds present, Non tender and not distended with no gaurding, rigidity or rebound. Extremities: B/L Lower Ext shows no edema, both legs are warm to touch Neurology: Awake alert, and oriented X 3, CN II-XII intact, Non focal Skin: No Rash  Data Review Lab Results  Component Value Date   HGBA1C 5.90 02/21/2015   HGBA1C 5.6 12/31/2013    Assessment & Plan   1. Essential  hypertension Refill - hydrochlorothiazide (HYDRODIURIL) 25 MG tablet; Take 1 tablet (25 mg total) by mouth daily.  Dispense: 90 tablet; Refill: 3 - potassium chloride SA (K-DUR,KLOR-CON) 20 MEQ tablet; Take 1 tablet (20 mEq total) by mouth daily.  Dispense: 30 tablet; Refill: 3  - COMPLETE METABOLIC PANEL WITH GFR - POCT glycosylated hemoglobin (Hb A1C)  We have discussed target BP range and blood pressure goal. I have advised patient to check BP regularly and to call us back or report to clinic if the numbers are consistently higher than 140/90. We discussed the importance of compliance with medical therapy and DASH diet recommended, consequences of uncontrolled hypertension discussed.  - continue current BP medications  2. Tobacco use  - buPROPion (WELLBUTRIN SR) 150 MG 12 hr tablet; Take 1 tablet (150 mg total) by mouth 2 (two) times daily.  Dispense: 180 tablet; Refill: 3 - VITAMIN D 25 Hydroxy (Vit-D Deficiency, Fractures)  [name] was counseled on the dangers of tobacco use, and was advised to quit. Reviewed strategies to maximize success, including removing cigarettes and smoking materials from environment, stress management and support of family/friends.   3. Mixed hyperlipidemia  - rosuvastatin (CRESTOR) 20 MG tablet; Take 1 tablet (20 mg total) by mouth daily.  Dispense: 90 tablet; Refill: 3 - Lipid panel  To address this please limit saturated fat to no more than 7% of your calories, limit cholesterol to 200 mg/day, increase fiber and exercise as tolerated. If needed we may add another cholesterol lowering medication to your regimen.   4. Arteriosclerotic coronary artery disease  - lisinopril (PRINIVIL,ZESTRIL) 40 MG tablet; Take 1 tablet (40 mg total) by mouth daily.  Dispense: 90 tablet; Refill: 3 - fenofibrate 160 MG tablet; Take 1 tablet (160 mg total) by mouth daily.  Dispense: 90 tablet; Refill: 3 - clopidogrel (PLAVIX) 75 MG tablet; Take 1 tablet (75 mg total) by mouth  daily with breakfast.  Dispense: 90 tablet; Refill: 3 - TSH  5. Benign prostatic hyperplasia with urinary frequency  - Urinalysis, Complete - PSA  Patient have been counseled extensively about nutrition and exercise. Other issues discussed during this visit include: low cholesterol diet, weight control and daily exercise, importance of adherence with medications and regular follow-up. We also discussed long term complications of uncontrolled hypertension.   Return in about 3 months (around 07/13/2016) for Follow up HTN, CAD, Routine Follow Up.  The patient was given clear instructions to go to ER or return to medical center if symptoms don't improve, worsen or new problems develop. The patient verbalized understanding. The patient was told to call to get lab results if they haven't heard anything in the next week.   This note has been created with Surveyor, quantity. Any transcriptional errors are unintentional.    Aerin Delany, MD, MHA, FACP, FAAP, [place] and  [place], [place]   04/12/2016, 5:20 PM

## 2016-04-12 NOTE — Progress Notes (Signed)
Patient is here for MED REFILL  Patient denies pain at this time

## 2016-04-12 NOTE — Patient Instructions (Signed)
Smoking Cessation, Tips for Success If you are ready to quit smoking, congratulations! You have chosen to help yourself be healthier. Cigarettes bring nicotine, tar, carbon monoxide, and other irritants into your body. Your lungs, heart, and blood vessels will be able to work better without these poisons. There are many different ways to quit smoking. Nicotine gum, nicotine patches, a nicotine inhaler, or nicotine nasal spray can help with physical craving. Hypnosis, support groups, and medicines help break the habit of smoking. WHAT THINGS CAN I DO TO MAKE QUITTING EASIER?  Here are some tips to help you quit for good:  Pick a date when you will quit smoking completely. Tell all of your friends and family about your plan to quit on that date.  Do not try to slowly cut down on the number of cigarettes you are smoking. Pick a quit date and quit smoking completely starting on that day.  Throw away all cigarettes.   Clean and remove all ashtrays from your home, work, and car.  On a card, write down your reasons for quitting. Carry the card with you and read it when you get the urge to smoke.  Cleanse your body of nicotine. Drink enough water and fluids to keep your urine clear or pale yellow. Do this after quitting to flush the nicotine from your body.  Learn to predict your moods. Do not let a bad situation be your excuse to have a cigarette. Some situations in your life might tempt you into wanting a cigarette.  Never have "just one" cigarette. It leads to wanting another and another. Remind yourself of your decision to quit.  Change habits associated with smoking. If you smoked while driving or when feeling stressed, try other activities to replace smoking. Stand up when drinking your coffee. Brush your teeth after eating. Sit in a different chair when you read the paper. Avoid alcohol while trying to quit, and try to drink fewer caffeinated beverages. Alcohol and caffeine may urge you to  smoke.  Avoid foods and drinks that can trigger a desire to smoke, such as sugary or spicy foods and alcohol.  Ask people who smoke not to smoke around you.  Have something planned to do right after eating or having a cup of coffee. For example, plan to take a walk or exercise.  Try a relaxation exercise to calm you down and decrease your stress. Remember, you may be tense and nervous for the first 2 weeks after you quit, but this will pass.  Find new activities to keep your hands busy. Play with a pen, coin, or rubber band. Doodle or draw things on paper.  Brush your teeth right after eating. This will help cut down on the craving for the taste of tobacco after meals. You can also try mouthwash.   Use oral substitutes in place of cigarettes. Try using lemon drops, carrots, cinnamon sticks, or chewing gum. Keep them handy so they are available when you have the urge to smoke.  When you have the urge to smoke, try deep breathing.  Designate your home as a nonsmoking area.  If you are a heavy smoker, ask your health care provider about a prescription for nicotine chewing gum. It can ease your withdrawal from nicotine.  Reward yourself. Set aside the cigarette money you save and buy yourself something nice.  Look for support from others. Join a support group or smoking cessation program. Ask someone at home or at work to help you with your plan   to quit smoking.  Always ask yourself, "Do I need this cigarette or is this just a reflex?" Tell yourself, "Today, I choose not to smoke," or "I do not want to smoke." You are reminding yourself of your decision to quit.  Do not replace cigarette smoking with electronic cigarettes (commonly called e-cigarettes). The safety of e-cigarettes is unknown, and some may contain harmful chemicals.  If you relapse, do not give up! Plan ahead and think about what you will do the next time you get the urge to smoke. HOW WILL I FEEL WHEN I QUIT SMOKING? You  may have symptoms of withdrawal because your body is used to nicotine (the addictive substance in cigarettes). You may crave cigarettes, be irritable, feel very hungry, cough often, get headaches, or have difficulty concentrating. The withdrawal symptoms are only temporary. They are strongest when you first quit but will go away within 10-14 days. When withdrawal symptoms occur, stay in control. Think about your reasons for quitting. Remind yourself that these are signs that your body is healing and getting used to being without cigarettes. Remember that withdrawal symptoms are easier to treat than the major diseases that smoking can cause.  Even after the withdrawal is over, expect periodic urges to smoke. However, these cravings are generally short lived and will go away whether you smoke or not. Do not smoke! WHAT RESOURCES ARE AVAILABLE TO HELP ME QUIT SMOKING? Your health care provider can direct you to community resources or hospitals for support, which may include:  Group support.  Education.  Hypnosis.  Therapy.   This information is not intended to replace advice given to you by your health care provider. Make sure you discuss any questions you have with your health care provider.   Document Released: 03/23/2004 Document Revised: 07/16/2014 Document Reviewed: 12/11/2012 Elsevier Interactive Patient Education 2016 [employer]. Hypertension Hypertension, commonly called high blood pressure, is when the force of blood pumping through your arteries is too strong. Your arteries are the blood vessels that carry blood from your heart throughout your body. A blood pressure reading consists of a higher number over a lower number, such as 110/72. The higher number (systolic) is the pressure inside your arteries when your heart pumps. The lower number (diastolic) is the pressure inside your arteries when your heart relaxes. Ideally you want your blood pressure below 120/80. Hypertension forces  your heart to work harder to pump blood. Your arteries may become narrow or stiff. Having untreated or uncontrolled hypertension can cause heart attack, stroke, kidney disease, and other problems. RISK FACTORS Some risk factors for high blood pressure are controllable. Others are not.  Risk factors you cannot control include:   Race. You may be at higher risk if you are African American.  Age. Risk increases with age.  Gender. Men are at higher risk than women before age 68 years. After age 74, women are at higher risk than men. Risk factors you can control include:  Not getting enough exercise or physical activity.  Being overweight.  Getting too much fat, sugar, calories, or salt in your diet.  Drinking too much alcohol. SIGNS AND SYMPTOMS Hypertension does not usually cause signs or symptoms. Extremely high blood pressure (hypertensive crisis) may cause headache, anxiety, shortness of breath, and nosebleed. DIAGNOSIS To check if you have hypertension, your health care provider will measure your blood pressure while you are seated, with your arm held at the level of your heart. It should be measured at least twice using  the same arm. Certain conditions can cause a difference in blood pressure between your right and left arms. A blood pressure reading that is higher than normal on one occasion does not mean that you need treatment. If it is not clear whether you have high blood pressure, you may be asked to return on a different day to have your blood pressure checked again. Or, you may be asked to monitor your blood pressure at home for 1 or more weeks. TREATMENT Treating high blood pressure includes making lifestyle changes and possibly taking medicine. Living a healthy lifestyle can help lower high blood pressure. You may need to change some of your habits. Lifestyle changes may include:  Following the DASH diet. This diet is high in fruits, vegetables, and whole grains. It is low in  salt, red meat, and added sugars.  Keep your sodium intake below 2,300 mg per day.  Getting at least 30-45 minutes of aerobic exercise at least 4 times per week.  Losing weight if necessary.  Not smoking.  Limiting alcoholic beverages.  Learning ways to reduce stress. Your health care provider may prescribe medicine if lifestyle changes are not enough to get your blood pressure under control, and if one of the following is true:  You are 44-41 years of age and your systolic blood pressure is above 140.  You are 88 years of age or older, and your systolic blood pressure is above 150.  Your diastolic blood pressure is above 90.  You have diabetes, and your systolic blood pressure is over 694 or your diastolic blood pressure is over 90.  You have kidney disease and your blood pressure is above 140/90.  You have heart disease and your blood pressure is above 140/90. Your personal target blood pressure may vary depending on your medical conditions, your age, and other factors. HOME CARE INSTRUCTIONS  Have your blood pressure rechecked as directed by your health care provider.   Take medicines only as directed by your health care provider. Follow the directions carefully. Blood pressure medicines must be taken as prescribed. The medicine does not work as well when you skip doses. Skipping doses also puts you at risk for problems.  Do not smoke.   Monitor your blood pressure at home as directed by your health care provider. SEEK MEDICAL CARE IF:   You think you are having a reaction to medicines taken.  You have recurrent headaches or feel dizzy.  You have swelling in your ankles.  You have trouble with your vision. SEEK IMMEDIATE MEDICAL CARE IF:  You develop a severe headache or confusion.  You have unusual weakness, numbness, or feel faint.  You have severe chest or abdominal pain.  You vomit repeatedly.  You have trouble breathing. MAKE SURE YOU:   Understand  these instructions.  Will watch your condition.  Will get help right away if you are not doing well or get worse.   This information is not intended to replace advice given to you by your health care provider. Make sure you discuss any questions you have with your health care provider.   Document Released: 06/25/2005 Document Revised: 11/09/2014 Document Reviewed: 04/17/2013 Elsevier Interactive Patient Education [employer].

## 2016-04-13 LAB — LIPID PANEL
Cholesterol: 250 mg/dL — ABNORMAL HIGH (ref 125–200)
HDL: 33 mg/dL — AB (ref >=40)
LDL CALC: 175 mg/dL — AB (ref <130)
TRIGLYCERIDES: 212 mg/dL — AB (ref <150)
Total CHOL/HDL Ratio: 7.6 Ratio — ABNORMAL HIGH (ref <=5.0)
VLDL: 42 mg/dL — AB (ref <30)

## 2016-04-13 LAB — URINALYSIS, COMPLETE
BACTERIA UA: NONE SEEN [HPF]
Bilirubin Urine: NEGATIVE
Casts: NONE SEEN [LPF]
Crystals: NONE SEEN [HPF]
GLUCOSE, UA: NEGATIVE
KETONES UR: NEGATIVE
Nitrite: NEGATIVE
PROTEIN: NEGATIVE
SQUAMOUS EPITHELIAL / LPF: NONE SEEN [HPF] (ref <=5)
Specific Gravity, Urine: 1.021 (ref 1.001–1.035)
YEAST: NONE SEEN [HPF]
pH: 5.5 (ref 5.0–8.0)

## 2016-04-13 LAB — COMPLETE METABOLIC PANEL WITH GFR
ALT: 19 U/L (ref 9–46)
AST: 16 U/L (ref 10–35)
Albumin: 4.2 g/dL (ref 3.6–5.1)
Alkaline Phosphatase: 44 U/L (ref 40–115)
BILIRUBIN TOTAL: 0.6 mg/dL (ref 0.2–1.2)
BUN: 30 mg/dL — ABNORMAL HIGH (ref 7–25)
CHLORIDE: 106 mmol/L (ref 98–110)
CO2: 26 mmol/L (ref 20–31)
Calcium: 9.9 mg/dL (ref 8.6–10.3)
Creat: 1.67 mg/dL — ABNORMAL HIGH (ref 0.70–1.33)
GFR, EST AFRICAN AMERICAN: 51 mL/min — AB (ref >=60)
GFR, EST NON AFRICAN AMERICAN: 44 mL/min — AB (ref >=60)
Glucose, Bld: 90 mg/dL (ref 65–99)
Potassium: 4.6 mmol/L (ref 3.5–5.3)
Sodium: 144 mmol/L (ref 135–146)
Total Protein: 7 g/dL (ref 6.1–8.1)

## 2016-04-13 LAB — VITAMIN D 25 HYDROXY (VIT D DEFICIENCY, FRACTURES): VIT D 25 HYDROXY: 16 ng/mL — AB (ref 30–100)

## 2016-04-22 ENCOUNTER — Other Ambulatory Visit: Payer: Self-pay | Admitting: Internal Medicine

## 2016-04-22 MED ORDER — VITAMIN D (ERGOCALCIFEROL) 1.25 MG (50000 UNIT) PO CAPS: 50000.0000 [IU] | ORAL_CAPSULE | ORAL | 3 refills | Status: DC

## 2016-04-24 ENCOUNTER — Telehealth: Payer: Self-pay | Admitting: *Deleted

## 2016-04-24 ENCOUNTER — Telehealth: Payer: Self-pay | Admitting: Internal Medicine

## 2016-04-24 NOTE — Telephone Encounter (Signed)
Patient verified DOB Patient is aware of lab results showing low vitamin d levels. Patient is aware of medication being ordered to the pharmacy. Patient is also aware of cholesterol level being elevated and patient needing to limit saturated fat and cholesterol intake. Patient is aware of kidney function reducing and being advised to control blood pressure to avoid further damage. Patient is aware of a cholesterol medication being added if patient is unable to lower levels with current plan. Patient expressed his understanding.

## 2016-04-24 NOTE — Telephone Encounter (Signed)
-----   Message from Tresa Garter, MD sent at 04/22/2016  3:22 PM EDT ----- Please inform patient that lab results showed low vitamin D level, needs replacement, very high cholesterol level and reduced kidney function. Recommendation: Adhere with cholesterol medications as prescribed and please limit saturated fat to no more than 7% of your calories, limit cholesterol to 200 mg/day, increase fiber and exercise as tolerated. If needed we may add another cholesterol lowering medication to your regimen or increase the dosage of the current medications. Vitamin D has been prescribed to the pharmacy for pickup. To prevent further kidney damage, ensure strict blood pressure control by adhering with current medications as prescribed, low salt diet.

## 2016-04-24 NOTE — Telephone Encounter (Signed)
Patient returned nurse phone all in regard to lab results. Please follow up.

## 2016-04-24 NOTE — Telephone Encounter (Signed)
Medical Assistant left message on patient's home and cell voicemail. Voicemail states to give a call back to Elkin Belfield with [facility] at [phone].  

## 2016-05-03 ENCOUNTER — Other Ambulatory Visit: Payer: Self-pay | Admitting: Internal Medicine

## 2016-05-03 MED ORDER — METOPROLOL TARTRATE 50 MG PO TABS: 50.0000 mg | ORAL_TABLET | Freq: Two times a day (BID) | ORAL | 3 refills | Status: DC

## 2016-05-03 NOTE — Telephone Encounter (Signed)
Done

## 2016-05-04 NOTE — Telephone Encounter (Signed)
Thank you :)

## 2016-07-06 ENCOUNTER — Other Ambulatory Visit: Payer: Self-pay | Admitting: Internal Medicine

## 2016-07-06 DIAGNOSIS — I10 Essential (primary) hypertension: Secondary | ICD-10-CM

## 2016-07-06 DIAGNOSIS — N401 Enlarged prostate with lower urinary tract symptoms: Secondary | ICD-10-CM

## 2016-07-06 DIAGNOSIS — R350 Frequency of micturition: Principal | ICD-10-CM

## 2016-09-18 ENCOUNTER — Other Ambulatory Visit: Payer: Self-pay | Admitting: Internal Medicine

## 2016-09-18 ENCOUNTER — Telehealth: Payer: Self-pay | Admitting: Internal Medicine

## 2016-09-18 DIAGNOSIS — N401 Enlarged prostate with lower urinary tract symptoms: Secondary | ICD-10-CM

## 2016-09-18 DIAGNOSIS — R350 Frequency of micturition: Principal | ICD-10-CM

## 2016-09-18 MED ORDER — TAMSULOSIN HCL 0.4 MG PO CAPS: 0.4000 mg | ORAL_CAPSULE | Freq: Every day | ORAL | 0 refills | Status: DC

## 2016-09-18 NOTE — Telephone Encounter (Signed)
Patient requesting prescription refill for Flomax

## 2016-09-18 NOTE — Telephone Encounter (Signed)
Tamsulosin refilled.

## 2016-09-19 ENCOUNTER — Other Ambulatory Visit: Payer: Self-pay | Admitting: Internal Medicine

## 2016-09-19 ENCOUNTER — Ambulatory Visit: Payer: Federal, State, Local not specified - PPO | Attending: Internal Medicine | Admitting: Internal Medicine

## 2016-09-19 ENCOUNTER — Encounter: Payer: Self-pay | Admitting: Internal Medicine

## 2016-09-19 VITALS — BP 130/70 | HR 58 | Temp 98.6°F | Resp 18 | Ht 76.0 in | Wt 268.6 lb

## 2016-09-19 DIAGNOSIS — R9431 Abnormal electrocardiogram [ECG] [EKG]: Secondary | ICD-10-CM | POA: Diagnosis not present

## 2016-09-19 DIAGNOSIS — Z720 Tobacco use: Secondary | ICD-10-CM | POA: Diagnosis not present

## 2016-09-19 DIAGNOSIS — N401 Enlarged prostate with lower urinary tract symptoms: Secondary | ICD-10-CM | POA: Diagnosis not present

## 2016-09-19 DIAGNOSIS — I2510 Atherosclerotic heart disease of native coronary artery without angina pectoris: Secondary | ICD-10-CM | POA: Insufficient documentation

## 2016-09-19 DIAGNOSIS — R350 Frequency of micturition: Principal | ICD-10-CM

## 2016-09-19 DIAGNOSIS — N400 Enlarged prostate without lower urinary tract symptoms: Secondary | ICD-10-CM | POA: Diagnosis not present

## 2016-09-19 DIAGNOSIS — Z79899 Other long term (current) drug therapy: Secondary | ICD-10-CM | POA: Insufficient documentation

## 2016-09-19 DIAGNOSIS — Z7982 Long term (current) use of aspirin: Secondary | ICD-10-CM | POA: Insufficient documentation

## 2016-09-19 DIAGNOSIS — I10 Essential (primary) hypertension: Secondary | ICD-10-CM | POA: Insufficient documentation

## 2016-09-19 DIAGNOSIS — E559 Vitamin D deficiency, unspecified: Secondary | ICD-10-CM | POA: Diagnosis not present

## 2016-09-19 DIAGNOSIS — Z1211 Encounter for screening for malignant neoplasm of colon: Secondary | ICD-10-CM | POA: Insufficient documentation

## 2016-09-19 DIAGNOSIS — E782 Mixed hyperlipidemia: Secondary | ICD-10-CM | POA: Insufficient documentation

## 2016-09-19 LAB — COMPLETE METABOLIC PANEL WITH GFR
ALK PHOS: 43 U/L (ref 40–115)
ALT: 24 U/L (ref 9–46)
AST: 19 U/L (ref 10–35)
Albumin: 4.1 g/dL (ref 3.6–5.1)
BUN: 30 mg/dL — AB (ref 7–25)
CHLORIDE: 104 mmol/L (ref 98–110)
CO2: 25 mmol/L (ref 20–31)
Calcium: 9.8 mg/dL (ref 8.6–10.3)
Creat: 1.45 mg/dL — ABNORMAL HIGH (ref 0.70–1.25)
GFR, Est African American: 60 mL/min (ref >=60)
GFR, Est Non African American: 52 mL/min — ABNORMAL LOW (ref >=60)
GLUCOSE: 82 mg/dL (ref 65–99)
POTASSIUM: 4.2 mmol/L (ref 3.5–5.3)
SODIUM: 141 mmol/L (ref 135–146)
Total Bilirubin: 0.5 mg/dL (ref 0.2–1.2)
Total Protein: 6.8 g/dL (ref 6.1–8.1)

## 2016-09-19 LAB — CBC WITH DIFFERENTIAL/PLATELET
Basophils Absolute: 0 cells/uL (ref 0–200)
Basophils Relative: 0 %
EOS PCT: 3 %
Eosinophils Absolute: 336 cells/uL (ref 15–500)
HCT: 50.3 % — ABNORMAL HIGH (ref 38.5–50.0)
HEMOGLOBIN: 16.7 g/dL (ref 13.2–17.1)
LYMPHS ABS: 3024 {cells}/uL (ref 850–3900)
Lymphocytes Relative: 27 %
MCH: 29.5 pg (ref 27.0–33.0)
MCHC: 33.2 g/dL (ref 32.0–36.0)
MCV: 88.7 fL (ref 80.0–100.0)
MPV: 11.1 fL (ref 7.5–12.5)
Monocytes Absolute: 1232 cells/uL — ABNORMAL HIGH (ref 200–950)
Monocytes Relative: 11 %
NEUTROS ABS: 6608 {cells}/uL (ref 1500–7800)
NEUTROS PCT: 59 %
Platelets: 276 10*3/uL (ref 140–400)
RBC: 5.67 MIL/uL (ref 4.20–5.80)
RDW: 14.4 % (ref 11.0–15.0)
WBC: 11.2 10*3/uL — AB (ref 3.8–10.8)

## 2016-09-19 LAB — LIPID PANEL
CHOL/HDL RATIO: 6.5 ratio — AB (ref <5.0)
Cholesterol: 201 mg/dL — ABNORMAL HIGH (ref <200)
HDL: 31 mg/dL — ABNORMAL LOW (ref >40)
LDL CALC: 97 mg/dL (ref <100)
Triglycerides: 364 mg/dL — ABNORMAL HIGH (ref <150)
VLDL: 73 mg/dL — AB (ref <30)

## 2016-09-19 LAB — POCT GLYCOSYLATED HEMOGLOBIN (HGB A1C): HEMOGLOBIN A1C: 5.8

## 2016-09-19 MED ORDER — TAMSULOSIN HCL 0.4 MG PO CAPS: 0.4000 mg | ORAL_CAPSULE | Freq: Every day | ORAL | 3 refills | Status: DC

## 2016-09-19 MED ORDER — INDOMETHACIN 25 MG PO CAPS: ORAL_CAPSULE | ORAL | 3 refills | Status: DC

## 2016-09-19 MED ORDER — ESZOPICLONE 3 MG PO TABS: ORAL_TABLET | ORAL | 3 refills | Status: DC

## 2016-09-19 NOTE — Patient Instructions (Addendum)
Steps to Quit Smoking Smoking tobacco can be bad for your health. It can also affect almost every organ in your body. Smoking puts you and people around you at risk for many serious long-lasting (chronic) diseases. Quitting smoking is hard, but it is one of the best things that you can do for your health. It is never too late to quit. What are the benefits of quitting smoking? When you quit smoking, you lower your risk for getting serious diseases and conditions. They can include:  Lung cancer or lung disease.  Heart disease.  Stroke.  Heart attack.  Not being able to have children (infertility).  Weak bones (osteoporosis) and broken bones (fractures). If you have coughing, wheezing, and shortness of breath, those symptoms may get better when you quit. You may also get sick less often. If you are pregnant, quitting smoking can help to lower your chances of having a baby of low birth weight. What can I do to help me quit smoking? Talk with your doctor about what can help you quit smoking. Some things you can do (strategies) include:  Quitting smoking totally, instead of slowly cutting back how much you smoke over a period of time.  Going to in-person counseling. You are more likely to quit if you go to many counseling sessions.  Using resources and support systems, such as:  Online chats with a counselor.  Phone quitlines.  Printed self-help materials.  Support groups or group counseling.  Text messaging programs.  Mobile phone apps or applications.  Taking medicines. Some of these medicines may have nicotine in them. If you are pregnant or breastfeeding, do not take any medicines to quit smoking unless your doctor says it is okay. Talk with your doctor about counseling or other things that can help you. Talk with your doctor about using more than one strategy at the same time, such as taking medicines while you are also going to in-person counseling. This can help make quitting  easier. What things can I do to make it easier to quit? Quitting smoking might feel very hard at first, but there is a lot that you can do to make it easier. Take these steps:  Talk to your family and friends. Ask them to support and encourage you.  Call phone quitlines, reach out to support groups, or work with a counselor.  Ask people who smoke to not smoke around you.  Avoid places that make you want (trigger) to smoke, such as:  Bars.  Parties.  Smoke-break areas at work.  Spend time with people who do not smoke.  Lower the stress in your life. Stress can make you want to smoke. Try these things to help your stress:  Getting regular exercise.  Deep-breathing exercises.  Yoga.  Meditating.  Doing a body scan. To do this, close your eyes, focus on one area of your body at a time from head to toe, and notice which parts of your body are tense. Try to relax the muscles in those areas.  Download or buy apps on your mobile phone or tablet that can help you stick to your quit plan. There are many free apps, such as QuitGuide from the CDC (Centers for Disease Control and Prevention). You can find more support from smokefree.gov and other websites. This information is not intended to replace advice given to you by your health care provider. Make sure you discuss any questions you have with your health care provider. Document Released: 04/21/2009 Document Revised: 02/21/2016 Document   Reviewed: 11/09/2014 Elsevier Interactive Patient Education  2017 [place] DASH stands for "Dietary Approaches to Stop Hypertension." The DASH eating plan is a healthy eating plan that has been shown to reduce high blood pressure (hypertension). It may also reduce your risk for type 2 diabetes, heart disease, and stroke. The DASH eating plan may also help with weight loss. What are tips for following this plan? General guidelines   Avoid eating more than 2,300 mg (milligrams) of  salt (sodium) a day. If you have hypertension, you may need to reduce your sodium intake to 1,500 mg a day.  Limit alcohol intake to no more than 1 drink a day for nonpregnant women and 2 drinks a day for men. One drink equals 12 oz of beer, 5 oz of wine, or 1 oz of hard liquor.  Work with your health care provider to maintain a healthy body weight or to lose weight. Ask what an ideal weight is for you.  Get at least 30 minutes of exercise that causes your heart to beat faster (aerobic exercise) most days of the week. Activities may include walking, swimming, or biking.  Work with your health care provider or diet and nutrition specialist (dietitian) to adjust your eating plan to your individual calorie needs. Reading food labels   Check food labels for the amount of sodium per serving. Choose foods with less than 5 percent of the Daily Value of sodium. Generally, foods with less than 300 mg of sodium per serving fit into this eating plan.  To find whole grains, look for the word "whole" as the first word in the ingredient list. Shopping   Buy products labeled as "low-sodium" or "no salt added."  Buy fresh foods. Avoid canned foods and premade or frozen meals. Cooking   Avoid adding salt when cooking. Use salt-free seasonings or herbs instead of table salt or sea salt. Check with your health care provider or pharmacist before using salt substitutes.  Do not fry foods. Cook foods using healthy methods such as baking, boiling, grilling, and broiling instead.  Cook with heart-healthy oils, such as olive, canola, soybean, or sunflower oil. Meal planning    Eat a balanced diet that includes:  5 or more servings of fruits and vegetables each day. At each meal, try to fill half of your plate with fruits and vegetables.  Up to 6-8 servings of whole grains each day.  Less than 6 oz of lean meat, poultry, or fish each day. A 3-oz serving of meat is about the same size as a deck of cards.  One egg equals 1 oz.  2 servings of low-fat dairy each day.  A serving of nuts, seeds, or beans 5 times each week.  Heart-healthy fats. Healthy fats called Omega-3 fatty acids are found in foods such as flaxseeds and coldwater fish, like sardines, salmon, and mackerel.  Limit how much you eat of the following:  Canned or prepackaged foods.  Food that is high in trans fat, such as fried foods.  Food that is high in saturated fat, such as fatty meat.  Sweets, desserts, sugary drinks, and other foods with added sugar.  Full-fat dairy products.  Do not salt foods before eating.  Try to eat at least 2 vegetarian meals each week.  Eat more home-cooked food and less restaurant, buffet, and fast food.  When eating at a restaurant, ask that your food be prepared with less salt or no salt, if possible. What foods are  recommended? The items listed may not be a complete list. Talk with your dietitian about what dietary choices are best for you. Grains  Whole-grain or whole-wheat bread. Whole-grain or whole-wheat pasta. Brown rice. [place]. Bulgur. Whole-grain and low-sodium cereals. Pita bread. Low-fat, low-sodium crackers. Whole-wheat flour tortillas. Vegetables  Fresh or frozen vegetables (raw, steamed, roasted, or grilled). Low-sodium or reduced-sodium tomato and vegetable juice. Low-sodium or reduced-sodium tomato sauce and tomato paste. Low-sodium or reduced-sodium canned vegetables. Fruits  All fresh, dried, or frozen fruit. Canned fruit in natural juice (without added sugar). Meat and other protein foods  Skinless chicken or [place]. Ground chicken or [place]. Pork with fat trimmed off. Fish and seafood. Egg whites. Dried beans, peas, or lentils. Unsalted nuts, nut butters, and seeds. Unsalted canned beans. Lean cuts of beef with fat trimmed off. Low-sodium, lean deli meat. Dairy  Low-fat (1%) or fat-free (skim) milk. Fat-free, low-fat, or reduced-fat cheeses. Nonfat,  low-sodium ricotta or cottage cheese. Low-fat or nonfat yogurt. Low-fat, low-sodium cheese. Fats and oils  Soft margarine without trans fats. Vegetable oil. Low-fat, reduced-fat, or light mayonnaise and salad dressings (reduced-sodium). Canola, safflower, olive, soybean, and sunflower oils. Avocado. Seasoning and other foods  Herbs. Spices. Seasoning mixes without salt. Unsalted popcorn and pretzels. Fat-free sweets. What foods are not recommended? The items listed may not be a complete list. Talk with your dietitian about what dietary choices are best for you. Grains  Baked goods made with fat, such as croissants, muffins, or some breads. Dry pasta or rice meal packs. Vegetables  Creamed or fried vegetables. Vegetables in a cheese sauce. Regular canned vegetables (not low-sodium or reduced-sodium). Regular canned tomato sauce and paste (not low-sodium or reduced-sodium). Regular tomato and vegetable juice (not low-sodium or reduced-sodium). Angie Fava. Olives. Fruits  Canned fruit in a light or heavy syrup. Fried fruit. Fruit in cream or butter sauce. Meat and other protein foods  Fatty cuts of meat. Ribs. Fried meat. Berniece Salines. Sausage. Bologna and other processed lunch meats. Salami. Fatback. Hotdogs. Bratwurst. Salted nuts and seeds. Canned beans with added salt. Canned or smoked fish. Whole eggs or egg yolks. Chicken or [place] with skin. Dairy  Whole or 2% milk, cream, and half-and-half. Whole or full-fat cream cheese. Whole-fat or sweetened yogurt. Full-fat cheese. Nondairy creamers. Whipped toppings. Processed cheese and cheese spreads. Fats and oils  Butter. Stick margarine. Lard. Shortening. Ghee. Bacon fat. Tropical oils, such as coconut, palm kernel, or palm oil. Seasoning and other foods  Salted popcorn and pretzels. Onion salt, garlic salt, seasoned salt, table salt, and sea salt. Worcestershire sauce. Tartar sauce. Barbecue sauce. Teriyaki sauce. Soy sauce, including reduced-sodium. Steak  sauce. Canned and packaged gravies. Fish sauce. Oyster sauce. Cocktail sauce. Horseradish that you find on the shelf. Ketchup. Mustard. Meat flavorings and tenderizers. Bouillon cubes. Hot sauce and Tabasco sauce. Premade or packaged marinades. Premade or packaged taco seasonings. Relishes. Regular salad dressings. Where to find more information:  National Heart, Lung, and [place]: https://wilson-eaton.com/  American Heart Association: www.heart.org Summary  The DASH eating plan is a healthy eating plan that has been shown to reduce high blood pressure (hypertension). It may also reduce your risk for type 2 diabetes, heart disease, and stroke.  With the DASH eating plan, you should limit salt (sodium) intake to 2,300 mg a day. If you have hypertension, you may need to reduce your sodium intake to 1,500 mg a day.  When on the DASH eating plan, aim to eat more fresh fruits and vegetables,  whole grains, lean proteins, low-fat dairy, and heart-healthy fats.  Work with your health care provider or diet and nutrition specialist (dietitian) to adjust your eating plan to your individual calorie needs. This information is not intended to replace advice given to you by your health care provider. Make sure you discuss any questions you have with your health care provider. Document Released: 06/14/2011 Document Revised: 06/18/2016 Document Reviewed: 06/18/2016 Elsevier Interactive Patient Education  2017 [employer] Inc. Hypertension Hypertension, commonly called high blood pressure, is when the force of blood pumping through the arteries is too strong. The arteries are the blood vessels that carry blood from the heart throughout the body. Hypertension forces the heart to work harder to pump blood and may cause arteries to become narrow or stiff. Having untreated or uncontrolled hypertension can cause heart attacks, strokes, kidney disease, and other problems. A blood pressure reading consists of a higher  number over a lower number. Ideally, your blood pressure should be below 120/80. The first ("top") number is called the systolic pressure. It is a measure of the pressure in your arteries as your heart beats. The second ("bottom") number is called the diastolic pressure. It is a measure of the pressure in your arteries as the heart relaxes. What are the causes? The cause of this condition is not known. What increases the risk? Some risk factors for high blood pressure are under your control. Others are not. Factors you can change   Smoking.  Having type 2 diabetes mellitus, high cholesterol, or both.  Not getting enough exercise or physical activity.  Being overweight.  Having too much fat, sugar, calories, or salt (sodium) in your diet.  Drinking too much alcohol. Factors that are difficult or impossible to change   Having chronic kidney disease.  Having a family history of high blood pressure.  Age. Risk increases with age.  Race. You may be at higher risk if you are African-American.  Gender. Men are at higher risk than women before age 33. After age 41, women are at higher risk than men.  Having obstructive sleep apnea.  Stress. What are the signs or symptoms? Extremely high blood pressure (hypertensive crisis) may cause:  Headache.  Anxiety.  Shortness of breath.  Nosebleed.  Nausea and vomiting.  Severe chest pain.  Jerky movements you cannot control (seizures). How is this diagnosed? This condition is diagnosed by measuring your blood pressure while you are seated, with your arm resting on a surface. The cuff of the blood pressure monitor will be placed directly against the skin of your upper arm at the level of your heart. It should be measured at least twice using the same arm. Certain conditions can cause a difference in blood pressure between your right and left arms. Certain factors can cause blood pressure readings to be lower or higher than normal  (elevated) for a short period of time:  When your blood pressure is higher when you are in a health care provider's office than when you are at home, this is called white coat hypertension. Most people with this condition do not need medicines.  When your blood pressure is higher at home than when you are in a health care provider's office, this is called masked hypertension. Most people with this condition may need medicines to control blood pressure. If you have a high blood pressure reading during one visit or you have normal blood pressure with other risk factors:  You may be asked to return on a different  day to have your blood pressure checked again.  You may be asked to monitor your blood pressure at home for 1 week or longer. If you are diagnosed with hypertension, you may have other blood or imaging tests to help your health care provider understand your overall risk for other conditions. How is this treated? This condition is treated by making healthy lifestyle changes, such as eating healthy foods, exercising more, and reducing your alcohol intake. Your health care provider may prescribe medicine if lifestyle changes are not enough to get your blood pressure under control, and if:  Your systolic blood pressure is above 130.  Your diastolic blood pressure is above 80. Your personal target blood pressure may vary depending on your medical conditions, your age, and other factors. Follow these instructions at home: Eating and drinking   Eat a diet that is high in fiber and potassium, and low in sodium, added sugar, and fat. An example eating plan is called the DASH (Dietary Approaches to Stop Hypertension) diet. To eat this way:  Eat plenty of fresh fruits and vegetables. Try to fill half of your plate at each meal with fruits and vegetables.  Eat whole grains, such as whole wheat pasta, brown rice, or whole grain bread. Fill about one quarter of your plate with whole grains.  Eat  or drink low-fat dairy products, such as skim milk or low-fat yogurt.  Avoid fatty cuts of meat, processed or cured meats, and poultry with skin. Fill about one quarter of your plate with lean proteins, such as fish, chicken without skin, beans, eggs, and tofu.  Avoid premade and processed foods. These tend to be higher in sodium, added sugar, and fat.  Reduce your daily sodium intake. Most people with hypertension should eat less than 1,500 mg of sodium a day.  Limit alcohol intake to no more than 1 drink a day for nonpregnant women and 2 drinks a day for men. One drink equals 12 oz of beer, 5 oz of wine, or 1 oz of hard liquor. Lifestyle   Work with your health care provider to maintain a healthy body weight or to lose weight. Ask what an ideal weight is for you.  Get at least 30 minutes of exercise that causes your heart to beat faster (aerobic exercise) most days of the week. Activities may include walking, swimming, or biking.  Include exercise to strengthen your muscles (resistance exercise), such as pilates or lifting weights, as part of your weekly exercise routine. Try to do these types of exercises for 30 minutes at least 3 days a week.  Do not use any products that contain nicotine or tobacco, such as cigarettes and e-cigarettes. If you need help quitting, ask your health care provider.  Monitor your blood pressure at home as told by your health care provider.  Keep all follow-up visits as told by your health care provider. This is important. Medicines   Take over-the-counter and prescription medicines only as told by your health care provider. Follow directions carefully. Blood pressure medicines must be taken as prescribed.  Do not skip doses of blood pressure medicine. Doing this puts you at risk for problems and can make the medicine less effective.  Ask your health care provider about side effects or reactions to medicines that you should watch for. Contact a health care  provider if:  You think you are having a reaction to a medicine you are taking.  You have headaches that keep coming back (recurring).  You  feel dizzy.  You have swelling in your ankles.  You have trouble with your vision. Get help right away if:  You develop a severe headache or confusion.  You have unusual weakness or numbness.  You feel faint.  You have severe pain in your chest or abdomen.  You vomit repeatedly.  You have trouble breathing. Summary  Hypertension is when the force of blood pumping through your arteries is too strong. If this condition is not controlled, it may put you at risk for serious complications.  Your personal target blood pressure may vary depending on your medical conditions, your age, and other factors. For most people, a normal blood pressure is less than 120/80.  Hypertension is treated with lifestyle changes, medicines, or a combination of both. Lifestyle changes include weight loss, eating a healthy, low-sodium diet, exercising more, and limiting alcohol. This information is not intended to replace advice given to you by your health care provider. Make sure you discuss any questions you have with your health care provider. Document Released: 06/25/2005 Document Revised: 05/23/2016 Document Reviewed: 05/23/2016 Elsevier Interactive Patient Education  2017 [employer].

## 2016-09-19 NOTE — Progress Notes (Signed)
Patient is here for HTN and Med Refill  Patient denies pain at this time.  Patient has taken medication today. Patient has eaten today.  Patient request a refill on Indocin, Lunesta, and Eszopiclone. Patient is requesting a 90 day supply for economical reasons.

## 2016-09-19 NOTE — Progress Notes (Signed)
Subjective:  Patient ID: [name], male    DOB: 08-29-1955  Age: 61 y.o. MRN: [id number]  CC: Hypertension and Medication Refill  HPI [name] presents for follow up visit. 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 He is also continuing to smoke approximately 10 cigarettes a day. He reports compliance with his medications, denies pain and side effects to medications. He feels his blood pressure has improved with current medications. He reports improved depressive symptoms but has several stressors with joblessness and re-locating. He also has fears of possible homelessness. He is currently living in [place], [place] and does not want to go for colonoscopy or cardiology until he gets more established in an area. He was due to have an echo and cardiac ultrasound but did not due to $800 copay. He denies recent gout attacks. He feels his bph symptoms are not as well controlled as they were initially when he started the flomax. He has been out of the medication for 2 weeks and requests 90 day supply. He does not exercise.   Past Medical History:  Diagnosis Date  . Coronary artery disease   . Gout   . High cholesterol   . Hypertension    Past Surgical History:  Procedure Laterality Date  . CAROTID STENT     Family History  Problem Relation Age of Onset  . Hypertension Father    Social History  Substance Use Topics  . Smoking status: Current Every Day Smoker    Packs/day: 0.50  . Smokeless tobacco: Current User  . Alcohol use Yes   ROS Review of Systems  Constitutional: Negative.   HENT: Positive for congestion and sinus pressure. Negative for sinus pain and sore throat.   Eyes: Negative.   Respiratory: Negative.   Cardiovascular: Negative.   Gastrointestinal: Negative.   Endocrine: Negative.   Genitourinary: Positive for decreased urine volume, frequency and urgency.  Musculoskeletal: Negative.   Skin:  Negative.   Neurological: Negative.   Psychiatric/Behavioral: Negative.     Objective:   Today's Vitals: BP 130/70 (BP Location: Left Arm, Patient Position: Sitting, Cuff Size: Normal)   Pulse (!) 58   Temp 98.6 F (37 C) (Oral)   Resp 18   Ht '6\' 4"'$  (1.93 m)   Wt 268 lb 9.6 oz (121.8 kg)   SpO2 96%   BMI 32.70 kg/m   Physical Exam  Constitutional: He appears well-developed and well-nourished.  HENT:  Head: Normocephalic and atraumatic.  Right Ear: External ear normal.  Left Ear: External ear normal.  Nose: Nose normal.  Mouth/Throat: Oropharynx is clear and moist.  Eyes: Conjunctivae are normal. Pupils are equal, round, and reactive to light. Right eye exhibits no discharge. Left eye exhibits no discharge.  Neck: Normal range of motion. Neck supple. No JVD present. No thyromegaly present.  Cardiovascular: An irregular rhythm present.  Ectopic beats  Pulmonary/Chest: Effort normal and breath sounds normal.  Abdominal: Soft. He exhibits no distension. There is no tenderness.  Musculoskeletal: Normal range of motion. He exhibits no edema.  Lymphadenopathy:    He has no cervical adenopathy.  Neurological: He is alert.  Skin: Skin is warm and dry.  Psychiatric: He has a normal mood and affect. His behavior is normal.    Assessment & Plan:   HTN - blood pressure appears well controlled today on current medication regimen. We have discussed target BP range and blood pressure goal. I  have advised patient to check BP regularly and to call us back or report to clinic if the numbers are consistently higher than 140/90. We discussed the importance of compliance with medical therapy and DASH diet recommended, consequences of uncontrolled hypertension discussed.  - continue current BP medications - CMet - POCT hba1c  Tobacco use - Pt was counseled on the danger of tobacco use and advised to quit. Reviewed strategies to maximize success including removing cigarettes, smoking  materials from environment, stress management, and support of family/friends, and setting a quit date.  - continue wellbutrin  Vitamin d deficiency - his level was previously low. We discussed taking this medication regularly and will check his levels today.   Mixed hyperlipidemia - his cholesterol remains elevated on previous lab checks. We will continue the crestor and recheck his cholesterol today. He was advised to limit saturated fat to no more than 7% of the calorie intake, and less than '200mg'$ /day, to increase fiber and exercise.  - Crestor, fenofibrate - lipid panel   Arteriosclerotic coronary artery disease - On exam, ectopic beats and an irregular rhythm was auscultated. This was consistent with the exam in October when an EKG was performed and revealed PVCs. He is  Asymptomatic so we will not repeat the ecg today. I do feel he would benefit from an echo. He has previously been referred for this but due to cost he has not completed. This was encouraged today.  - plavix - TSH  Benign prostatic hyperplasia w/ urinary frequency - He currently has symptoms and we have room to go up on his medications but first I would like him to re-start his medications, take them consistently, then re-evaluate if he is still symptomatic before increasing his dose.   Health maintenance- He is due for colonoscopy this year. We will make this referral and he can choose where to go.   Outpatient Encounter Prescriptions as of 09/19/2016  Medication Sig  . aspirin 325 MG tablet Take 325 mg by mouth daily.  Marland Kitchen buPROPion (WELLBUTRIN SR) 150 MG 12 hr tablet Take 1 tablet (150 mg total) by mouth 2 (two) times daily.  . clopidogrel (PLAVIX) 75 MG tablet Take 1 tablet (75 mg total) by mouth daily with breakfast.  . colchicine 0.6 MG tablet Take 1 tablet (0.6 mg total) by mouth daily.  . Eszopiclone 3 MG TABS TAKE 1 TABLET BY MOUTH AT BEDTIME AS NEEDED SLEEP. TAKE IMMEDIATELY BEFORE BEDTIME  . fenofibrate 160 MG  tablet Take 1 tablet (160 mg total) by mouth daily.  . hydrochlorothiazide (HYDRODIURIL) 25 MG tablet Take 1 tablet (25 mg total) by mouth daily.  . indomethacin (INDOCIN) 25 MG capsule TAKE 1 CAPSULE BY MOUTH 3 TIMES DAILY AS NEEDED.  Marland Kitchen lisinopril (PRINIVIL,ZESTRIL) 40 MG tablet Take 1 tablet (40 mg total) by mouth daily.  . metoprolol (LOPRESSOR) 50 MG tablet Take 1 tablet (50 mg total) by mouth 2 (two) times daily.  . rosuvastatin (CRESTOR) 20 MG tablet Take 1 tablet (20 mg total) by mouth daily.  . tamsulosin (FLOMAX) 0.4 MG CAPS capsule Take 1 capsule (0.4 mg total) by mouth daily.  . Vitamin D, Ergocalciferol, (DRISDOL) 50000 units CAPS capsule Take 1 capsule (50,000 Units total) by mouth every 7 (seven) days.  . [DISCONTINUED] Eszopiclone 3 MG TABS TAKE 1 TABLET BY MOUTH AT BEDTIME AS NEEDED SLEEP. TAKE IMMEDIATELY BEFORE BEDTIME  . [DISCONTINUED] indomethacin (INDOCIN) 25 MG capsule TAKE 1 CAPSULE BY MOUTH 3 TIMES DAILY AS NEEDED.  . [  DISCONTINUED] KLOR-CON M20 20 MEQ tablet TAKE 1 TABLET (20 MEQ TOTAL) BY MOUTH DAILY.  . [DISCONTINUED] tamsulosin (FLOMAX) 0.4 MG CAPS capsule Take 1 capsule (0.4 mg total) by mouth daily.   No facility-administered encounter medications on file as of 09/19/2016.     Follow-up: Return in about 6 months (around 03/22/2017) for Follow up HTN, Follow up Pain and comorbidities.    Beckey Rutter, AGNP Student   Evaluation and management procedures were performed by me with DNP Student in attendance, note written by DNP student under my supervision and collaboration. I have reviewed the note and I agree with the management and plan.   Angelica Chessman, MD, [place], [place], [place], [place], [place]   09/22/2016, 11:18 AM

## 2016-09-20 LAB — VITAMIN D 25 HYDROXY (VIT D DEFICIENCY, FRACTURES): Vit D, 25-Hydroxy: 29 ng/mL — ABNORMAL LOW (ref 30–100)

## 2016-09-25 NOTE — Telephone Encounter (Signed)
-----   Message from Tresa Garter, MD sent at 09/21/2016  9:50 AM EDT ----- Lab results showed improving cholesterol level, improved Vit D level, stable kidney function. No change in current regimen, please limit saturated fat to no more than 7% of your calories, limit cholesterol to 200 mg/day, increase fiber and exercise as tolerated.

## 2016-09-25 NOTE — Telephone Encounter (Signed)
Patient verified DOB Patient is aware of cholesterol and Vitamin D level improving. Patient is aware of kidney function being stable and no changes being made to the current regimen. Patient advised to limited saturated fat and cholesterol intake and to increase fiber and exercise. Patient is aware of potassium level being within range and klor-con not being needed at this time. Patient expressed his understanding and had no further questions.

## 2016-12-01 ENCOUNTER — Other Ambulatory Visit: Payer: Self-pay | Admitting: Internal Medicine

## 2016-12-18 ENCOUNTER — Encounter: Payer: Self-pay | Admitting: Internal Medicine

## 2017-04-02 ENCOUNTER — Other Ambulatory Visit: Payer: Self-pay | Admitting: Internal Medicine

## 2017-04-02 DIAGNOSIS — E782 Mixed hyperlipidemia: Secondary | ICD-10-CM

## 2017-04-11 ENCOUNTER — Other Ambulatory Visit: Payer: Self-pay | Admitting: Internal Medicine

## 2017-04-11 DIAGNOSIS — I2510 Atherosclerotic heart disease of native coronary artery without angina pectoris: Secondary | ICD-10-CM

## 2017-04-11 DIAGNOSIS — I10 Essential (primary) hypertension: Secondary | ICD-10-CM

## 2017-05-07 ENCOUNTER — Telehealth: Payer: Self-pay | Admitting: Internal Medicine

## 2017-05-07 NOTE — Telephone Encounter (Signed)
Patient has not been seen since March 2018, will forward refill request to Dr. Doreene Burke.  Agree with advice that if patient has continued nosebleed, he needs to follow up with Urgent or ED. Thanks [place]!

## 2017-05-07 NOTE — Telephone Encounter (Signed)
Patient called and asked for a refill on all his prescriptions please fu at [phone]. He also stated that his nose has been bleeding since 5 am I told him he could go to the ER or urgent care.

## 2017-06-05 ENCOUNTER — Encounter: Payer: Self-pay | Admitting: Internal Medicine

## 2017-06-05 ENCOUNTER — Ambulatory Visit: Payer: Federal, State, Local not specified - PPO | Attending: Internal Medicine | Admitting: Internal Medicine

## 2017-06-05 VITALS — BP 150/75 | HR 50 | Temp 98.8°F | Resp 18 | Ht 76.0 in | Wt 277.0 lb

## 2017-06-05 DIAGNOSIS — I10 Essential (primary) hypertension: Secondary | ICD-10-CM | POA: Diagnosis not present

## 2017-06-05 DIAGNOSIS — E669 Obesity, unspecified: Secondary | ICD-10-CM | POA: Insufficient documentation

## 2017-06-05 DIAGNOSIS — Z720 Tobacco use: Secondary | ICD-10-CM

## 2017-06-05 DIAGNOSIS — N401 Enlarged prostate with lower urinary tract symptoms: Secondary | ICD-10-CM | POA: Diagnosis not present

## 2017-06-05 DIAGNOSIS — I252 Old myocardial infarction: Secondary | ICD-10-CM | POA: Diagnosis not present

## 2017-06-05 DIAGNOSIS — E782 Mixed hyperlipidemia: Secondary | ICD-10-CM

## 2017-06-05 DIAGNOSIS — I2510 Atherosclerotic heart disease of native coronary artery without angina pectoris: Secondary | ICD-10-CM

## 2017-06-05 DIAGNOSIS — N400 Enlarged prostate without lower urinary tract symptoms: Secondary | ICD-10-CM | POA: Insufficient documentation

## 2017-06-05 DIAGNOSIS — Z760 Encounter for issue of repeat prescription: Secondary | ICD-10-CM | POA: Diagnosis present

## 2017-06-05 DIAGNOSIS — R350 Frequency of micturition: Secondary | ICD-10-CM

## 2017-06-05 DIAGNOSIS — M109 Gout, unspecified: Secondary | ICD-10-CM | POA: Diagnosis not present

## 2017-06-05 MED ORDER — ESZOPICLONE 3 MG PO TABS: ORAL_TABLET | ORAL | 3 refills | Status: DC

## 2017-06-05 MED ORDER — CLOPIDOGREL BISULFATE 75 MG PO TABS: 75.0000 mg | ORAL_TABLET | Freq: Every day | ORAL | 3 refills | Status: DC

## 2017-06-05 MED ORDER — ACETAMINOPHEN-CODEINE #3 300-30 MG PO TABS: 1.0000 | ORAL_TABLET | ORAL | 0 refills | Status: DC | PRN

## 2017-06-05 MED ORDER — METOPROLOL TARTRATE 50 MG PO TABS: 50.0000 mg | ORAL_TABLET | Freq: Two times a day (BID) | ORAL | 3 refills | Status: DC

## 2017-06-05 MED ORDER — HYDROCHLOROTHIAZIDE 25 MG PO TABS: 25.0000 mg | ORAL_TABLET | Freq: Every day | ORAL | 3 refills | Status: DC

## 2017-06-05 MED ORDER — COLCHICINE 0.6 MG PO TABS: 0.6000 mg | ORAL_TABLET | Freq: Every day | ORAL | 3 refills | Status: DC

## 2017-06-05 MED ORDER — TAMSULOSIN HCL 0.4 MG PO CAPS: 0.4000 mg | ORAL_CAPSULE | Freq: Every day | ORAL | 3 refills | Status: DC

## 2017-06-05 MED ORDER — LISINOPRIL 40 MG PO TABS: 40.0000 mg | ORAL_TABLET | Freq: Every day | ORAL | 3 refills | Status: DC

## 2017-06-05 MED ORDER — ROSUVASTATIN CALCIUM 20 MG PO TABS: 20.0000 mg | ORAL_TABLET | Freq: Every day | ORAL | 3 refills | Status: DC

## 2017-06-05 MED ORDER — BUPROPION HCL ER (SR) 150 MG PO TB12: 150.0000 mg | ORAL_TABLET | Freq: Two times a day (BID) | ORAL | 3 refills | Status: DC

## 2017-06-05 MED ORDER — FENOFIBRATE 160 MG PO TABS: 160.0000 mg | ORAL_TABLET | Freq: Every day | ORAL | 3 refills | Status: DC

## 2017-06-05 MED FILL — METOPROLOL TARTRATE 50 MG T: 50 | 90 days supply | Qty: 180 | Fill #0

## 2017-06-05 MED FILL — ACETAMINOPHEN/COD #3 TABLET: 300-30 | 5 days supply | Qty: 30 | Fill #0

## 2017-06-05 MED FILL — BUPROPION SR 150 MG TABLET: 150 | 30 days supply | Qty: 60 | Fill #0

## 2017-06-05 MED FILL — FENOFIBRATE 160 MG TABLET: 160 | 90 days supply | Qty: 90 | Fill #0

## 2017-06-05 MED FILL — ROSUVASTATIN CALCIUM 20 MG: 20 | 30 days supply | Qty: 30 | Fill #0

## 2017-06-05 MED FILL — HYDROCHLOROTHIAZIDE 25 MG T: 25 | 90 days supply | Qty: 90 | Fill #0

## 2017-06-05 MED FILL — CLOPIDOGREL 75 MG TABLET: 75 | 90 days supply | Qty: 90 | Fill #0

## 2017-06-05 MED FILL — LISINOPRIL 40 MG TAB: 40 | 90 days supply | Qty: 90 | Fill #0

## 2017-06-05 NOTE — Patient Instructions (Addendum)
DASH Eating Plan DASH stands for "Dietary Approaches to Stop Hypertension." The DASH eating plan is a healthy eating plan that has been shown to reduce high blood pressure (hypertension). It may also reduce your risk for type 2 diabetes, heart disease, and stroke. The DASH eating plan may also help with weight loss. What are tips for following this plan? General guidelines  Avoid eating more than 2,300 mg (milligrams) of salt (sodium) a day. If you have hypertension, you may need to reduce your sodium intake to 1,500 mg a day.  Limit alcohol intake to no more than 1 drink a day for nonpregnant women and 2 drinks a day for men. One drink equals 12 oz of beer, 5 oz of wine, or 1 oz of hard liquor.  Work with your health care provider to maintain a healthy body weight or to lose weight. Ask what an ideal weight is for you.  Get at least 30 minutes of exercise that causes your heart to beat faster (aerobic exercise) most days of the week. Activities may include walking, swimming, or biking.  Work with your health care provider or diet and nutrition specialist (dietitian) to adjust your eating plan to your individual calorie needs. Reading food labels  Check food labels for the amount of sodium per serving. Choose foods with less than 5 percent of the Daily Value of sodium. Generally, foods with less than 300 mg of sodium per serving fit into this eating plan.  To find whole grains, look for the word "whole" as the first word in the ingredient list. Shopping  Buy products labeled as "low-sodium" or "no salt added."  Buy fresh foods. Avoid canned foods and premade or frozen meals. Cooking  Avoid adding salt when cooking. Use salt-free seasonings or herbs instead of table salt or sea salt. Check with your health care provider or pharmacist before using salt substitutes.  Do not fry foods. Cook foods using healthy methods such as baking, boiling, grilling, and broiling instead.  Cook with  heart-healthy oils, such as olive, canola, soybean, or sunflower oil. Meal planning   Eat a balanced diet that includes: ? 5 or more servings of fruits and vegetables each day. At each meal, try to fill half of your plate with fruits and vegetables. ? Up to 6-8 servings of whole grains each day. ? Less than 6 oz of lean meat, poultry, or fish each day. A 3-oz serving of meat is about the same size as a deck of cards. One egg equals 1 oz. ? 2 servings of low-fat dairy each day. ? A serving of nuts, seeds, or beans 5 times each week. ? Heart-healthy fats. Healthy fats called Omega-3 fatty acids are found in foods such as flaxseeds and coldwater fish, like sardines, salmon, and mackerel.  Limit how much you eat of the following: ? Canned or prepackaged foods. ? Food that is high in trans fat, such as fried foods. ? Food that is high in saturated fat, such as fatty meat. ? Sweets, desserts, sugary drinks, and other foods with added sugar. ? Full-fat dairy products.  Do not salt foods before eating.  Try to eat at least 2 vegetarian meals each week.  Eat more home-cooked food and less restaurant, buffet, and fast food.  When eating at a restaurant, ask that your food be prepared with less salt or no salt, if possible. What foods are recommended? The items listed may not be a complete list. Talk with your dietitian about what   dietary choices are best for you. Grains Whole-grain or whole-wheat bread. Whole-grain or whole-wheat pasta. Brown rice. Oatmeal. Quinoa. Bulgur. Whole-grain and low-sodium cereals. Pita bread. Low-fat, low-sodium crackers. Whole-wheat flour tortillas. Vegetables Fresh or frozen vegetables (raw, steamed, roasted, or grilled). Low-sodium or reduced-sodium tomato and vegetable juice. Low-sodium or reduced-sodium tomato sauce and tomato paste. Low-sodium or reduced-sodium canned vegetables. Fruits All fresh, dried, or frozen fruit. Canned fruit in natural juice (without  added sugar). Meat and other protein foods Skinless chicken or turkey. Ground chicken or turkey. Pork with fat trimmed off. Fish and seafood. Egg whites. Dried beans, peas, or lentils. Unsalted nuts, nut butters, and seeds. Unsalted canned beans. Lean cuts of beef with fat trimmed off. Low-sodium, lean deli meat. Dairy Low-fat (1%) or fat-free (skim) milk. Fat-free, low-fat, or reduced-fat cheeses. Nonfat, low-sodium ricotta or cottage cheese. Low-fat or nonfat yogurt. Low-fat, low-sodium cheese. Fats and oils Soft margarine without trans fats. Vegetable oil. Low-fat, reduced-fat, or light mayonnaise and salad dressings (reduced-sodium). Canola, safflower, olive, soybean, and sunflower oils. Avocado. Seasoning and other foods Herbs. Spices. Seasoning mixes without salt. Unsalted popcorn and pretzels. Fat-free sweets. What foods are not recommended? The items listed may not be a complete list. Talk with your dietitian about what dietary choices are best for you. Grains Baked goods made with fat, such as croissants, muffins, or some breads. Dry pasta or rice meal packs. Vegetables Creamed or fried vegetables. Vegetables in a cheese sauce. Regular canned vegetables (not low-sodium or reduced-sodium). Regular canned tomato sauce and paste (not low-sodium or reduced-sodium). Regular tomato and vegetable juice (not low-sodium or reduced-sodium). Pickles. Olives. Fruits Canned fruit in a light or heavy syrup. Fried fruit. Fruit in cream or butter sauce. Meat and other protein foods Fatty cuts of meat. Ribs. Fried meat. Bacon. Sausage. Bologna and other processed lunch meats. Salami. Fatback. Hotdogs. Bratwurst. Salted nuts and seeds. Canned beans with added salt. Canned or smoked fish. Whole eggs or egg yolks. Chicken or turkey with skin. Dairy Whole or 2% milk, cream, and half-and-half. Whole or full-fat cream cheese. Whole-fat or sweetened yogurt. Full-fat cheese. Nondairy creamers. Whipped toppings.  Processed cheese and cheese spreads. Fats and oils Butter. Stick margarine. Lard. Shortening. Ghee. Bacon fat. Tropical oils, such as coconut, palm kernel, or palm oil. Seasoning and other foods Salted popcorn and pretzels. Onion salt, garlic salt, seasoned salt, table salt, and sea salt. Worcestershire sauce. Tartar sauce. Barbecue sauce. Teriyaki sauce. Soy sauce, including reduced-sodium. Steak sauce. Canned and packaged gravies. Fish sauce. Oyster sauce. Cocktail sauce. Horseradish that you find on the shelf. Ketchup. Mustard. Meat flavorings and tenderizers. Bouillon cubes. Hot sauce and Tabasco sauce. Premade or packaged marinades. Premade or packaged taco seasonings. Relishes. Regular salad dressings. Where to find more information:  National Heart, Lung, and Blood Institute: www.nhlbi.nih.gov  American Heart Association: www.heart.org Summary  The DASH eating plan is a healthy eating plan that has been shown to reduce high blood pressure (hypertension). It may also reduce your risk for type 2 diabetes, heart disease, and stroke.  With the DASH eating plan, you should limit salt (sodium) intake to 2,300 mg a day. If you have hypertension, you may need to reduce your sodium intake to 1,500 mg a day.  When on the DASH eating plan, aim to eat more fresh fruits and vegetables, whole grains, lean proteins, low-fat dairy, and heart-healthy fats.  Work with your health care provider or diet and nutrition specialist (dietitian) to adjust your eating plan to your individual   calorie needs. This information is not intended to replace advice given to you by your health care provider. Make sure you discuss any questions you have with your health care provider. Document Released: 06/14/2011 Document Revised: 06/18/2016 Document Reviewed: 06/18/2016 Elsevier Interactive Patient Education  2017 [employer] Hypertension Hypertension is another name for high blood pressure. High blood pressure forces  your heart to work harder to pump blood. This can cause problems over time. There are two numbers in a blood pressure reading. There is a top number (systolic) over a bottom number (diastolic). It is best to have a blood pressure below 120/80. Healthy choices can help lower your blood pressure. You may need medicine to help lower your blood pressure if:  Your blood pressure cannot be lowered with healthy choices.  Your blood pressure is higher than 130/80.  Follow these instructions at home: Eating and drinking  If directed, follow the DASH eating plan. This diet includes: ? Filling half of your plate at each meal with fruits and vegetables. ? Filling one quarter of your plate at each meal with whole grains. Whole grains include whole wheat pasta, brown rice, and whole grain bread. ? Eating or drinking low-fat dairy products, such as skim milk or low-fat yogurt. ? Filling one quarter of your plate at each meal with low-fat (lean) proteins. Low-fat proteins include fish, skinless chicken, eggs, beans, and tofu. ? Avoiding fatty meat, cured and processed meat, or chicken with skin. ? Avoiding premade or processed food.  Eat less than 1,500 mg of salt (sodium) a day.  Limit alcohol use to no more than 1 drink a day for nonpregnant women and 2 drinks a day for men. One drink equals 12 oz of beer, 5 oz of wine, or 1 oz of hard liquor. Lifestyle  Work with your doctor to stay at a healthy weight or to lose weight. Ask your doctor what the best weight is for you.  Get at least 30 minutes of exercise that causes your heart to beat faster (aerobic exercise) most days of the week. This may include walking, swimming, or biking.  Get at least 30 minutes of exercise that strengthens your muscles (resistance exercise) at least 3 days a week. This may include lifting weights or pilates.  Do not use any products that contain nicotine or tobacco. This includes cigarettes and e-cigarettes. If you need  help quitting, ask your doctor.  Check your blood pressure at home as told by your doctor.  Keep all follow-up visits as told by your doctor. This is important. Medicines  Take over-the-counter and prescription medicines only as told by your doctor. Follow directions carefully.  Do not skip doses of blood pressure medicine. The medicine does not work as well if you skip doses. Skipping doses also puts you at risk for problems.  Ask your doctor about side effects or reactions to medicines that you should watch for. Contact a doctor if:  You think you are having a reaction to the medicine you are taking.  You have headaches that keep coming back (recurring).  You feel dizzy.  You have swelling in your ankles.  You have trouble with your vision. Get help right away if:  You get a very bad headache.  You start to feel confused.  You feel weak or numb.  You feel faint.  You get very bad pain in your: ? Chest. ? Belly (abdomen).  You throw up (vomit) more than once.  You have trouble breathing. Summary  Hypertension is another name for high blood pressure.  Making healthy choices can help lower blood pressure. If your blood pressure cannot be controlled with healthy choices, you may need to take medicine. This information is not intended to replace advice given to you by your health care provider. Make sure you discuss any questions you have with your health care provider. Document Released: 12/12/2007 Document Revised: 05/23/2016 Document Reviewed: 05/23/2016 Elsevier Interactive Patient Education  2018 [place] Risks of Smoking Smoking cigarettes is very bad for your health. Tobacco smoke has over 200 known poisons in it. It contains the poisonous gases nitrogen oxide and carbon monoxide. There are over 60 chemicals in tobacco smoke that cause cancer. Smoking is difficult to quit because a chemical in tobacco, called nicotine, causes addiction or  dependence. When you smoke and inhale, nicotine is absorbed rapidly into the bloodstream through your lungs. Both inhaled and non-inhaled nicotine may be addictive. What are the risks of cigarette smoke? Cigarette smokers have an increased risk of many serious medical problems, including:  Lung cancer.  Lung disease, such as pneumonia, bronchitis, and emphysema.  Chest pain (angina) and heart attack because the heart is not getting enough oxygen.  Heart disease and peripheral blood vessel disease.  High blood pressure (hypertension).  Stroke.  Oral cancer, including cancer of the lip, mouth, or voice box.  Bladder cancer.  Pancreatic cancer.  Cervical cancer.  Pregnancy complications, including premature birth.  Stillbirths and smaller newborn babies, birth defects, and genetic damage to sperm.  Early menopause.  Lower estrogen level for women.  Infertility.  Facial wrinkles.  Blindness.  Increased risk of broken bones (fractures).  Senile dementia.  Stomach ulcers and internal bleeding.  Delayed wound healing and increased risk of complications during surgery.  Even smoking lightly shortens your life expectancy by several years.  Because of secondhand smoke exposure, children of smokers have an increased risk of the following:  Sudden infant death syndrome (SIDS).  Respiratory infections.  Lung cancer.  Heart disease.  Ear infections.  What are the benefits of quitting? There are many health benefits of quitting smoking. Here are some of them:  Within days of quitting smoking, your risk of having a heart attack decreases, your blood flow improves, and your lung capacity improves. Blood pressure, pulse rate, and breathing patterns start returning to normal soon after quitting.  Within months, your lungs may clear up completely.  Quitting for 10 years reduces your risk of developing lung cancer and heart disease to almost that of a  nonsmoker.  People who quit may see an improvement in their overall quality of life.  How do I quit smoking? Smoking is an addiction with both physical and psychological effects, and longtime habits can be hard to change. Your health care provider can recommend:  Programs and community resources, which may include group support, education, or talk therapy.  Prescription medicines to help reduce cravings.  Nicotine replacement products, such as patches, gum, and nasal sprays. Use these products only as directed. Do not replace cigarette smoking with electronic cigarettes, which are commonly called e-cigarettes. The safety of e-cigarettes is not known, and some may contain harmful chemicals.  A combination of two or more of these methods.  Where to find more information:  American Lung Association: www.lung.org  American Cancer Society: www.cancer.org Summary  Smoking cigarettes is very bad for your health. Cigarette smokers have an increased risk of many serious medical problems, including several cancers, heart disease, and  stroke.  Smoking is an addiction with both physical and psychological effects, and longtime habits can be hard to change.  By stopping right away, you can greatly reduce the risk of medical problems for you and your family.  To help you quit smoking, your health care provider can recommend programs, community resources, prescription medicines, and nicotine replacement products such as patches, gum, and nasal sprays. This information is not intended to replace advice given to you by your health care provider. Make sure you discuss any questions you have with your health care provider. Document Released: 08/02/2004 Document Revised: 06/29/2016 Document Reviewed: 06/29/2016 Elsevier Interactive Patient Education  2017 [employer]  Dyslipidemia Dyslipidemia is an imbalance of waxy, fat-like substances (lipids) in the blood. The body needs lipids in small amounts.  Dyslipidemia often involves a high level of cholesterol or triglycerides, which are types of lipids. Common forms of dyslipidemia include:  High levels of bad cholesterol (LDL cholesterol). LDL is the type of cholesterol that causes fatty deposits (plaques) to build up in the blood vessels that carry blood away from your heart (arteries).  Low levels of good cholesterol (HDL cholesterol). HDL cholesterol is the type of cholesterol that protects against heart disease. High levels of HDL remove the LDL buildup from arteries.  High levels of triglycerides. Triglycerides are a fatty substance in the blood that is linked to a buildup of plaques in the arteries.  You can develop dyslipidemia because of the genes you are born with (primary dyslipidemia) or changes that occur during your life (secondary dyslipidemia), or as a side effect of certain medical treatments. What are the causes? Primary dyslipidemia is caused by changes (mutations) in genes that are passed down through families (inherited). These mutations cause several types of dyslipidemia. Mutations can result in disorders that make the body produce too much LDL cholesterol or triglycerides, or not enough HDL cholesterol. These disorders may lead to heart disease, arterial disease, or stroke at an early age. Causes of secondary dyslipidemia include certain lifestyle choices and diseases that lead to dyslipidemia, such as:  Eating a diet that is high in animal fat.  Not getting enough activity or exercise (having a sedentary lifestyle).  Having diabetes, kidney disease, liver disease, or thyroid disease.  Drinking large amounts of alcohol.  Using certain types of drugs.  What increases the risk? You may be at greater risk for dyslipidemia if you are an older man or if you are a woman who has gone through menopause. Other risk factors include:  Having a family history of dyslipidemia.  Taking certain medicines, including birth control  pills, steroids, some diuretics, beta-blockers, and some medicines forHIV.  Smoking cigarettes.  Eating a high-fat diet.  Drinking large amounts of alcohol.  Having certain medical conditions such as diabetes, polycystic ovary syndrome (PCOS), pregnancy, kidney disease, liver disease, or hypothyroidism.  Not exercising regularly.  Being overweight or obese with too much belly fat.  What are the signs or symptoms? Dyslipidemia does not usually cause any symptoms. Very high lipid levels can cause fatty bumps under the skin (xanthomas) or a white or gray ring around the black center (pupil) of the eye. Very high triglyceride levels can cause inflammation of the pancreas (pancreatitis). How is this diagnosed? Your health care provider may diagnose dyslipidemia based on a routine blood test (fasting blood test). Because most people do not have symptoms of the condition, this blood testing (lipid profile) is done on adults age 33 and older and is repeated  every 5 years. This test checks:  Total cholesterol. This is a measure of the total amount of cholesterol in your blood, including LDL cholesterol, HDL cholesterol, and triglycerides. A healthy number is below 200.  LDL cholesterol. The target number for LDL cholesterol is different for each person, depending on individual risk factors. For most people, a number below 100 is healthy. Ask your health care provider what your LDL cholesterol number should be.  HDL cholesterol. An HDL level of 60 or higher is best because it helps to protect against heart disease. A number below 29 for men or below 79 for women increases the risk for heart disease.  Triglycerides. A healthy triglyceride number is below 150.  If your lipid profile is abnormal, your health care provider may do other blood tests to get more information about your condition. How is this treated? Treatment depends on the type of dyslipidemia that you have and your other risk factors  for heart disease and stroke. Your health care provider will have a target range for your lipid levels based on this information. For many people, treatment starts with lifestyle changes, such as diet and exercise. Your health care provider may recommend that you:  Get regular exercise.  Make changes to your diet.  Quit smoking if you smoke.  If diet changes and exercise do not help you reach your goals, your health care provider may also prescribe medicine to lower lipids. The most commonly prescribed type of medicine lowers your LDL cholesterol (statin drug). If you have a high triglyceride level, your provider may prescribe another type of drug (fibrate) or an omega-3 fish oil supplement, or both. Follow these instructions at home:  Take over-the-counter and prescription medicines only as told by your health care provider. This includes supplements.  Get regular exercise. Start an aerobic exercise and strength training program as told by your health care provider. Ask your health care provider what activities are safe for you. Your health care provider may recommend: ? 30 minutes of aerobic activity 4-6 days a week. Brisk walking is an example of aerobic activity. ? Strength training 2 days a week.  Eat a healthy diet as told by your health care provider. This can help you reach and maintain a healthy weight, lower your LDL cholesterol, and raise your HDL cholesterol. It may help to work with a diet and nutrition specialist (dietitian) to make a plan that is right for you. Your dietitian or health care provider may recommend: ? Limiting your calories, if you are overweight. ? Eating more fruits, vegetables, whole grains, fish, and lean meats. ? Limiting saturated fat, trans fat, and cholesterol.  Follow instructions from your health care provider or dietitian about eating or drinking restrictions.  Limit alcohol intake to no more than one drink per day for nonpregnant women and two drinks  per day for men. One drink equals 12 oz of beer, 5 oz of wine, or 1 oz of hard liquor.  Do not use any products that contain nicotine or tobacco, such as cigarettes and e-cigarettes. If you need help quitting, ask your health care provider.  Keep all follow-up visits as told by your health care provider. This is important. Contact a health care provider if:  You are having trouble sticking to your exercise or diet plan.  You are struggling to quit smoking or control your use of alcohol. Summary  Dyslipidemia is an imbalance of waxy, fat-like substances (lipids) in the blood. The body needs lipids in  small amounts. Dyslipidemia often involves a high level of cholesterol or triglycerides, which are types of lipids.  Treatment depends on the type of dyslipidemia that you have and your other risk factors for heart disease and stroke.  For many people, treatment starts with lifestyle changes, such as diet and exercise. Your health care provider may also prescribe medicine to lower lipids. This information is not intended to replace advice given to you by your health care provider. Make sure you discuss any questions you have with your health care provider. Document Released: 06/30/2013 Document Revised: 02/20/2016 Document Reviewed: 02/20/2016 Elsevier Interactive Patient Education  [employer].

## 2017-06-05 NOTE — Progress Notes (Signed)
[name], is a 61 y.o. male  AQT:[id number]  TGY:[id number]  DOB - September 10, 1955  Chief Complaint  Patient presents with  . Medication Refill  . Hypertension      Subjective:   [name] is a 61 y.o. male with hx of HTN, Gout, Hyperlipidemia, CAD, Depression, BPH, obesity, and MI , '07 & '15 with stent placement, tobacco use disorder who presents here today for a routine follow up visit and medication refills. Patient has no significant complaint today. He claims adherence to his medications but recently ran out off his blood pressure medications. No syncopal episodes. No fall. He is continues to smoke approximately 10 cigarettes a day and requests a refill of Wellbutrin to assist him quit. Patient has No headache, No chest pain, No abdominal pain - No Nausea, No new weakness tingling or numbness, No Cough - SOB.  ALLERGIES: No Known Allergies  PAST MEDICAL HISTORY: Past Medical History:  Diagnosis Date  . Coronary artery disease   . Gout   . High cholesterol   . Hypertension    MEDICATIONS AT HOME: Prior to Admission medications   Medication Sig Start Date End Date Taking? Authorizing Provider  aspirin 325 MG tablet Take 325 mg by mouth daily.   Yes [provider]  buPROPion (WELLBUTRIN SR) 150 MG 12 hr tablet Take 1 tablet (150 mg total) by mouth 2 (two) times daily. 06/05/17  Yes Tresa Garter, MD  clopidogrel (PLAVIX) 75 MG tablet Take 1 tablet (75 mg total) by mouth daily with breakfast. 06/05/17  Yes Shaquila Sigman E, MD  colchicine 0.6 MG tablet Take 1 tablet (0.6 mg total) by mouth daily. 06/05/17  Yes Praneel Haisley E, MD  Eszopiclone 3 MG TABS TAKE 1 TABLET BY MOUTH AT BEDTIME AS NEEDED SLEEP. TAKE IMMEDIATELY BEFORE BEDTIME 06/05/17  Yes Malayja Freund E, MD  fenofibrate 160 MG tablet Take 1 tablet (160 mg total) by mouth daily. 06/05/17  Yes Tresa Garter, MD  hydrochlorothiazide (HYDRODIURIL) 25 MG tablet Take 1 tablet (25  mg total) by mouth daily. 06/05/17  Yes Tresa Garter, MD  lisinopril (PRINIVIL,ZESTRIL) 40 MG tablet Take 1 tablet (40 mg total) by mouth daily. 06/05/17  Yes Tresa Garter, MD  metoprolol tartrate (LOPRESSOR) 50 MG tablet Take 1 tablet (50 mg total) by mouth 2 (two) times daily. 06/05/17  Yes Tresa Garter, MD  rosuvastatin (CRESTOR) 20 MG tablet Take 1 tablet (20 mg total) by mouth daily. 06/05/17  Yes Tresa Garter, MD  tamsulosin (FLOMAX) 0.4 MG CAPS capsule Take 1 capsule (0.4 mg total) by mouth daily. 06/05/17  Yes Tresa Garter, MD  Vitamin D, Ergocalciferol, (DRISDOL) 50000 units CAPS capsule Take 1 capsule (50,000 Units total) by mouth every 7 (seven) days. 04/22/16  Yes Tresa Garter, MD    Objective:   Vitals:   06/05/17 1408  BP: (!) 150/75  Pulse: (!) 50  Resp: 18  Temp: 98.8 F (37.1 C)  TempSrc: Oral  SpO2: 100%  Weight: 277 lb (125.6 kg)  Height: 6\' 4"  (1.93 m)   Exam General appearance : Awake, alert, not in any distress. Speech Clear. Not toxic looking HEENT: Atraumatic and Normocephalic, pupils equally reactive to light and accomodation Neck: Supple, no JVD. No cervical lymphadenopathy.  Chest: Good air entry bilaterally, no added sounds  CVS: S1 S2 regular, no murmurs.  Abdomen: Bowel sounds present, Non tender and not distended with no gaurding, rigidity or rebound. Extremities: B/L Lower  Ext shows no edema, both legs are warm to touch Neurology: Awake alert, and oriented X 3, CN II-XII intact, Non focal Skin: No Rash  Data Review Lab Results  Component Value Date   HGBA1C 5.8 09/19/2016   HGBA1C 5.90 02/21/2015   HGBA1C 5.6 12/31/2013   Assessment & Plan   1. Essential hypertension  - hydrochlorothiazide (HYDRODIURIL) 25 MG tablet; Take 1 tablet (25 mg total) by mouth daily.  Dispense: 90 tablet; Refill: 3 - lisinopril (PRINIVIL,ZESTRIL) 40 MG tablet; Take 1 tablet (40 mg total) by mouth daily.  Dispense: 90  tablet; Refill: 3  2. Mixed hyperlipidemia  - rosuvastatin (CRESTOR) 20 MG tablet; Take 1 tablet (20 mg total) by mouth daily.  Dispense: 90 tablet; Refill: 3  3. Tobacco use  - buPROPion (WELLBUTRIN SR) 150 MG 12 hr tablet; Take 1 tablet (150 mg total) by mouth 2 (two) times daily.  Dispense: 180 tablet; Refill: 3  [name] was counseled on the dangers of tobacco use, and was advised to quit. Reviewed strategies to maximize success, including removing cigarettes and smoking materials from environment, stress management and support of family/friends.  4. Arteriosclerotic coronary artery disease  - clopidogrel (PLAVIX) 75 MG tablet; Take 1 tablet (75 mg total) by mouth daily with breakfast.  Dispense: 90 tablet; Refill: 3 - colchicine 0.6 MG tablet; Take 1 tablet (0.6 mg total) by mouth daily.  Dispense: 90 tablet; Refill: 3 - fenofibrate 160 MG tablet; Take 1 tablet (160 mg total) by mouth daily.  Dispense: 90 tablet; Refill: 3  5. Benign prostatic hyperplasia with urinary frequency  - tamsulosin (FLOMAX) 0.4 MG CAPS capsule; Take 1 capsule (0.4 mg total) by mouth daily.  Dispense: 90 capsule; Refill: 3  Prescribed: - Eszopiclone 3 MG TABS; TAKE 1 TABLET BY MOUTH AT BEDTIME AS NEEDED SLEEP. TAKE IMMEDIATELY BEFORE BEDTIME  Dispense: 90 tablet; Refill: 3 for Insomnia  Patient have been counseled extensively about nutrition and exercise. Other issues discussed during this visit include: low cholesterol diet, weight control and daily exercise, importance of adherence with medications and regular follow-up. We also discussed long term complications of uncontrolled hypertension.   Return in about 6 months (around 12/03/2017) for Follow up HTN, Dyslipidemia.  The patient was given clear instructions to go to ER or return to medical center if symptoms don't improve, worsen or new problems develop. The patient verbalized understanding. The patient was told to call to get lab results if they haven't  heard anything in the next week.   This note has been created with Surveyor, quantity. Any transcriptional errors are unintentional.    Angelica Chessman, MD, [place], Karilyn Cota, [place] and [place], [place]   06/05/2017, 2:31 PM

## 2017-06-06 ENCOUNTER — Encounter: Payer: Self-pay | Admitting: Pharmacist

## 2017-06-06 NOTE — Progress Notes (Signed)
PA submitted for Tylenol #3. Pending approval/refusal.

## 2017-06-25 MED FILL — TAMSULOSIN HCL 0.4 MG CAP: 0.4 | 90 days supply | Qty: 90 | Fill #0 | Status: TO

## 2017-06-25 MED FILL — ACETAMINOPHEN/COD #3 TABLET: 300-30 | 5 days supply | Qty: 30 | Fill #1

## 2017-07-03 MED FILL — ROSUVASTATIN CALCIUM 20 MG: 20 | 90 days supply | Qty: 90 | Fill #1

## 2017-09-09 MED FILL — LISINOPRIL 40 MG TAB: 40 | 90 days supply | Qty: 90 | Fill #1

## 2017-09-09 MED FILL — METOPROLOL TARTRATE 50 MG T: 50 | 90 days supply | Qty: 180 | Fill #1

## 2017-09-09 MED FILL — HYDROCHLOROTHIAZIDE 25 MG T: 25 | 90 days supply | Qty: 90 | Fill #1

## 2017-09-09 MED FILL — CLOPIDOGREL 75 MG TABLET: 75 | 90 days supply | Qty: 90 | Fill #1

## 2017-09-09 MED FILL — BUPROPION SR 150 MG TABLET: 150 | 30 days supply | Qty: 60 | Fill #1

## 2017-09-09 MED FILL — FENOFIBRATE 160 MG TABLET: 160 | 90 days supply | Qty: 90 | Fill #1

## 2017-12-10 MED FILL — FENOFIBRATE 160 MG TABLET: 160 | 90 days supply | Qty: 90 | Fill #2 | Status: TO

## 2017-12-10 MED FILL — BUPROPION SR 150 MG TABLET: 150 | 30 days supply | Qty: 60 | Fill #2 | Status: TO

## 2017-12-10 MED FILL — ROSUVASTATIN CALCIUM 20 MG: 20 | 90 days supply | Qty: 90 | Fill #2 | Status: TO

## 2017-12-10 MED FILL — LISINOPRIL 40 MG TABLET: 40 | 90 days supply | Qty: 90 | Fill #2 | Status: TO

## 2017-12-10 MED FILL — CLOPIDOGREL 75 MG TABLET: 75 | 90 days supply | Qty: 90 | Fill #2 | Status: TO

## 2017-12-10 MED FILL — METOPROLOL TARTRATE 50 MG T: 50 | 90 days supply | Qty: 180 | Fill #2 | Status: TO

## 2017-12-10 MED FILL — HYDROCHLOROTHIAZIDE 25 MG T: 25 | 90 days supply | Qty: 90 | Fill #2 | Status: TO

## 2018-06-27 ENCOUNTER — Encounter: Payer: Self-pay | Admitting: Family Medicine

## 2018-06-27 ENCOUNTER — Ambulatory Visit: Payer: Self-pay | Attending: Family Medicine | Admitting: Family Medicine

## 2018-06-27 VITALS — BP 160/85 | HR 55 | Temp 98.0°F | Ht 76.0 in | Wt 277.2 lb

## 2018-06-27 DIAGNOSIS — M109 Gout, unspecified: Secondary | ICD-10-CM | POA: Insufficient documentation

## 2018-06-27 DIAGNOSIS — E782 Mixed hyperlipidemia: Secondary | ICD-10-CM

## 2018-06-27 DIAGNOSIS — Z87898 Personal history of other specified conditions: Secondary | ICD-10-CM

## 2018-06-27 DIAGNOSIS — R350 Frequency of micturition: Secondary | ICD-10-CM

## 2018-06-27 DIAGNOSIS — K0889 Other specified disorders of teeth and supporting structures: Secondary | ICD-10-CM

## 2018-06-27 DIAGNOSIS — Z8249 Family history of ischemic heart disease and other diseases of the circulatory system: Secondary | ICD-10-CM | POA: Insufficient documentation

## 2018-06-27 DIAGNOSIS — I2510 Atherosclerotic heart disease of native coronary artery without angina pectoris: Secondary | ICD-10-CM

## 2018-06-27 DIAGNOSIS — Z7902 Long term (current) use of antithrombotics/antiplatelets: Secondary | ICD-10-CM | POA: Insufficient documentation

## 2018-06-27 DIAGNOSIS — F419 Anxiety disorder, unspecified: Secondary | ICD-10-CM

## 2018-06-27 DIAGNOSIS — R351 Nocturia: Secondary | ICD-10-CM

## 2018-06-27 DIAGNOSIS — Z79899 Other long term (current) drug therapy: Secondary | ICD-10-CM | POA: Insufficient documentation

## 2018-06-27 DIAGNOSIS — F17210 Nicotine dependence, cigarettes, uncomplicated: Secondary | ICD-10-CM | POA: Insufficient documentation

## 2018-06-27 DIAGNOSIS — G4700 Insomnia, unspecified: Secondary | ICD-10-CM

## 2018-06-27 DIAGNOSIS — I252 Old myocardial infarction: Secondary | ICD-10-CM | POA: Insufficient documentation

## 2018-06-27 DIAGNOSIS — R7303 Prediabetes: Secondary | ICD-10-CM

## 2018-06-27 DIAGNOSIS — N401 Enlarged prostate with lower urinary tract symptoms: Secondary | ICD-10-CM

## 2018-06-27 DIAGNOSIS — Z720 Tobacco use: Secondary | ICD-10-CM

## 2018-06-27 DIAGNOSIS — Z955 Presence of coronary angioplasty implant and graft: Secondary | ICD-10-CM | POA: Insufficient documentation

## 2018-06-27 DIAGNOSIS — F329 Major depressive disorder, single episode, unspecified: Secondary | ICD-10-CM | POA: Insufficient documentation

## 2018-06-27 DIAGNOSIS — Z23 Encounter for immunization: Secondary | ICD-10-CM

## 2018-06-27 DIAGNOSIS — I10 Essential (primary) hypertension: Secondary | ICD-10-CM

## 2018-06-27 LAB — POCT GLYCOSYLATED HEMOGLOBIN (HGB A1C): HbA1c, POC (prediabetic range): 6.4 % (ref 5.7–6.4)

## 2018-06-27 MED ORDER — TAMSULOSIN HCL 0.4 MG PO CAPS: 0.4000 mg | ORAL_CAPSULE | Freq: Every day | ORAL | 3 refills | Status: DC

## 2018-06-27 MED ORDER — HYDROCHLOROTHIAZIDE 25 MG PO TABS: 25.0000 mg | ORAL_TABLET | Freq: Every day | ORAL | 1 refills | Status: DC

## 2018-06-27 MED ORDER — ESZOPICLONE 3 MG PO TABS: ORAL_TABLET | ORAL | 1 refills | Status: DC

## 2018-06-27 MED ORDER — CLOPIDOGREL BISULFATE 75 MG PO TABS: 75.0000 mg | ORAL_TABLET | Freq: Every day | ORAL | 1 refills | Status: DC

## 2018-06-27 MED ORDER — LISINOPRIL 40 MG PO TABS: 40.0000 mg | ORAL_TABLET | Freq: Every day | ORAL | 3 refills | Status: DC

## 2018-06-27 MED ORDER — CLOPIDOGREL BISULFATE 75 MG PO TABS: 75.0000 mg | ORAL_TABLET | Freq: Every day | ORAL | 3 refills | Status: DC

## 2018-06-27 MED ORDER — BUPROPION HCL ER (SR) 150 MG PO TB12: 150.0000 mg | ORAL_TABLET | Freq: Two times a day (BID) | ORAL | 1 refills | Status: DC

## 2018-06-27 MED ORDER — ACETAMINOPHEN-CODEINE #3 300-30 MG PO TABS: 1.0000 | ORAL_TABLET | Freq: Four times a day (QID) | ORAL | 0 refills | Status: DC | PRN

## 2018-06-27 MED ORDER — ROSUVASTATIN CALCIUM 20 MG PO TABS: 20.0000 mg | ORAL_TABLET | Freq: Every day | ORAL | 3 refills | Status: DC

## 2018-06-27 MED ORDER — HYDROCHLOROTHIAZIDE 25 MG PO TABS: 25.0000 mg | ORAL_TABLET | Freq: Every day | ORAL | 3 refills | Status: DC

## 2018-06-27 MED ORDER — LISINOPRIL 40 MG PO TABS: 40.0000 mg | ORAL_TABLET | Freq: Every day | ORAL | 1 refills | Status: DC

## 2018-06-27 MED ORDER — METOPROLOL TARTRATE 50 MG PO TABS: 50.0000 mg | ORAL_TABLET | Freq: Two times a day (BID) | ORAL | 3 refills | Status: DC

## 2018-06-27 MED ORDER — FENOFIBRATE 160 MG PO TABS: 160.0000 mg | ORAL_TABLET | Freq: Every day | ORAL | 3 refills | Status: DC

## 2018-06-27 MED ORDER — FENOFIBRATE 160 MG PO TABS: 160.0000 mg | ORAL_TABLET | Freq: Every day | ORAL | 1 refills | Status: DC

## 2018-06-27 MED ORDER — AMOXICILLIN 875 MG PO TABS: 875.0000 mg | ORAL_TABLET | Freq: Two times a day (BID) | ORAL | 0 refills | Status: DC

## 2018-06-27 MED ORDER — ROSUVASTATIN CALCIUM 20 MG PO TABS: 20.0000 mg | ORAL_TABLET | Freq: Every day | ORAL | 1 refills | Status: DC

## 2018-06-27 MED ORDER — TAMSULOSIN HCL 0.4 MG PO CAPS: 0.4000 mg | ORAL_CAPSULE | Freq: Every day | ORAL | 1 refills | Status: DC

## 2018-06-27 MED FILL — ACETAMINOPHEN/COD #3 TABLET: 300-30 | 3 days supply | Qty: 30 | Fill #0

## 2018-06-27 MED FILL — HYDROCHLOROTHIAZIDE 25 MG T: 25 | 30 days supply | Qty: 30 | Fill #0

## 2018-06-27 MED FILL — CLOPIDOGREL 75 MG TABLET: 75 | 30 days supply | Qty: 30 | Fill #0

## 2018-06-27 MED FILL — LISINOPRIL 40 MG TABLET: 40 | 30 days supply | Qty: 30 | Fill #0

## 2018-06-27 MED FILL — FENOFIBRATE 160 MG TABLET: 160 | 30 days supply | Qty: 30 | Fill #0

## 2018-06-27 MED FILL — TAMSULOSIN HCL 0.4 MG CAP: 0.4 | 30 days supply | Qty: 30 | Fill #0

## 2018-06-27 MED FILL — ROSUVASTATIN CALCIUM 20 MG: 20 | 30 days supply | Qty: 30 | Fill #0

## 2018-06-27 MED FILL — BUPROPION SR 150 MG TABLET: 150 | 30 days supply | Qty: 60 | Fill #0

## 2018-06-27 NOTE — Progress Notes (Signed)
Subjective:    Patient ID: [name], male    DOB: Aug 12, 1955, 62 y.o.   MRN: [id number]  HPI       62 yo male new to me as a patient who was last seen at this practice on 06/05/2017 by another provider.  Patient has a history of coronary artery disease status post MI x2 and patient had stent placement in 2015, hypertension, anxiety and depression, BPH, tobacco use, insomnia and a history of gout.  On review of patient's labs from his last visit, patient also had hemoglobin A1c of 5.8 consistent with prediabetes.  Patient states that he is here at today's visit for refill of his medications.  Patient also reports that he has had some recurrent issues with tooth pain and would like to have a new prescription for Tylenol 3 which he has taken in the past for pain.  Patient states that he is currently living in [facility] which is about 2 hours away.  P with the use of his cholesterol medication.  Patient has had no unusual bruising or bleeding with the use of Plavix atient states that he is working part-time at [employer].      Patient reports no issues with chest pain related to his coronary artery disease.  Patient states that he is taking all of his medications however he recently ran out of some of his blood pressure medication x2 days.  Patient reports no increase muscle aches with the use of his cholesterol medications.  Patient does continue to smoke as he states that this also helps with anxiety.  Patient would like to have a refill of Wellbutrin as he states that this does help with the anxiety as well.  Patient states that when he initially started taking Flomax he greatly helped decrease his nighttime urinary frequency but patient states that he is starting to have an increase in urinary frequency at night again.  Patient was unaware of prior hemoglobin A1c results consistent with prediabetes.  Patient states that he does have some occasional dry mouth as well as occasional  urinary frequency but his urinary frequency is usually at night about every 2-4 hours at times.  Patient denies any dysuria.  Patient denies any abdominal pain. Patient has had no recent issues with gout flareups and therefore does not need a refill of colchicine as he still has some of this medication at home.  Patient reports that he has completed prescription vitamin D therapy.  Past Medical History:  Diagnosis Date  . Coronary artery disease   . Gout   . High cholesterol   . Hypertension    Past Surgical History:  Procedure Laterality Date  . CAROTID STENT     Family History  Problem Relation Age of Onset  . Hypertension Father    Social History   Tobacco Use  . Smoking status: Current Every Day Smoker    Packs/day: 0.50  . Smokeless tobacco: Current User  Substance Use Topics  . Alcohol use: Yes  . Drug use: No  No Known Allergies   Review of Systems  Constitutional: Positive for fatigue (occasional). Negative for chills and fever.  HENT: Positive for congestion, dental problem, postnasal drip, rhinorrhea and sinus pressure. Negative for ear pain, sore throat and trouble swallowing.   Respiratory: Positive for cough (occasional non-productive cough). Negative for shortness of breath.   Cardiovascular: Positive for leg swelling (occasional when he has been standing all day at his job but the  swelling goes down overnight). Negative for chest pain and palpitations.  Gastrointestinal: Negative for abdominal pain, blood in stool, constipation, diarrhea, nausea and vomiting.  Endocrine: Positive for polydipsia (occasional). Negative for polyphagia.  Genitourinary: Positive for frequency (at night). Negative for dysuria.       Has noticed slowing and less forceful urinary stream  Musculoskeletal: Positive for back pain (occasional after prolonged standing). Negative for gait problem.  Neurological: Negative for dizziness and headaches.  Hematological: Negative for adenopathy.  Does not bruise/bleed easily.  Psychiatric/Behavioral: Positive for sleep disturbance. Negative for self-injury and suicidal ideas. The patient is nervous/anxious.        Objective:   Physical Exam BP (!) 160/85   Pulse (!) 55   Temp 98 F (36.7 C) (Oral)   Ht 6\' 4"  (1.93 m)   Wt 277 lb 3.2 oz (125.7 kg)   SpO2 96%   BMI 33.74 kg/m  Nurse's notes and vital signs reviewed General- well-nourished, well-developed large framed, overweight for height male in no acute distress.  Patient has a nasal quality to his voice ENT- TMs dull bilaterally, nares with moderate edema of the nasal mucosa with mild clear discharge, patient with posterior pharynx erythema/cobblestoning.  Patient with some mild tenderness over the left maxillary sinus/cheek but this is in the same area of an upper molar with dental decay and gum edema/erythema.  Patient also has right partially broken lower molar Neck- no lymphadenopathy, no thyromegaly, supple, no appreciable carotid bruits on exam Lungs-clear to auscultation bilaterally with decreased breath sounds of the lung bases, no increased work of breathing Cardiovascular-regular rate and rhythm Abdomen-mild truncal obesity, soft and nontender Back-no CVA tenderness Extremities-no edema Psych- normal mood and judgment within the exam room and patient is talkative and asking questions but prior to going into the exam room, patient was heard to come out of the room several times and complaining of feeling anxious due to the time that he had waited and because he wanted to make sure that he could pick up his medications before leaving (during the exam and after the exam I stated several times that I would need to go places orders for his medications so that he would be able to pick them up for the lab close however patient continued to talk and ask questions).       Assessment & Plan:  1. Essential hypertension Patient's blood pressure was slightly elevated at today's  visit but patient states that he has been out of blood pressure medication for about 2 days.  Patient is provided with refills of Lopressor, lisinopril and hydrochlorothiazide.  Patient will have lipid panel and CMP in follow-up of use of medications for treatment of hypertension and because of his known hypertension and heart disease - Comprehensive metabolic panel - Lipid Panel - hydrochlorothiazide (HYDRODIURIL) 25 MG tablet; Take 1 tablet (25 mg total) by mouth daily.  Dispense: 90 tablet; Refill: 1 - lisinopril (PRINIVIL,ZESTRIL) 40 MG tablet; Take 1 tablet (40 mg total) by mouth daily.  Dispense: 90 tablet; Refill: 1 - metoprolol tartrate (LOPRESSOR) 50 MG tablet; Take 1 tablet (50 mg total) by mouth 2 (two) times daily.  Dispense: 180 tablet; Refill: 3  2. Mixed hyperlipidemia Patient with mixed hyperlipidemia and patient will have lipid panel in follow-up as well as CMP.  Patient is provided with refill of Crestor and fenofibrate.  Patient will be notified if he can discontinue the fenofibrate if his triglycerides are within normal in order to help prevent increased  risk of myalgias associated with the use of taking both Crestor and fenofibrate - Comprehensive metabolic panel - Lipid Panel - rosuvastatin (CRESTOR) 20 MG tablet; Take 1 tablet (20 mg total) by mouth daily.  Dispense: 90 tablet; Refill: 1  3. Arteriosclerotic coronary artery disease Patient with history of coronary artery disease due to atherosclerosis for which he has had stent placement after MI x2.  Patient is provided with refill of Plavix and patient should continue the use of his fenofibrate and Crestor as well as blood pressure medications.  Complete smoking cessation, weight loss and cardiovascular exercise are encouraged.  Patient will also have CMP and lipid panel - Comprehensive metabolic panel - Lipid Panel - clopidogrel (PLAVIX) 75 MG tablet; Take 1 tablet (75 mg total) by mouth daily with breakfast.  Dispense: 90  tablet; Refill: 1 - fenofibrate 160 MG tablet; Take 1 tablet (160 mg total) by mouth daily.  Dispense: 90 tablet; Refill: 1 - lisinopril (PRINIVIL,ZESTRIL) 40 MG tablet; Take 1 tablet (40 mg total) by mouth daily.  Dispense: 90 tablet; Refill: 1 - rosuvastatin (CRESTOR) 20 MG tablet; Take 1 tablet (20 mg total) by mouth daily.  Dispense: 90 tablet; Refill: 1  4. Prediabetes Patient's hemoglobin A1c at today's visit was elevated at 6.4 discussed with the patient that A1c level of 6.5 or greater is diagnostic of diabetes.  Discussed having patient start metformin to help with insulin resistance but patient states that he wishes to try diet and exercise first to see if this will lower his blood sugars.  Patient has been asked to return to clinic in 6 months for follow-up of his prediabetes and sooner if he has issues with increased thirst, blurred vision, worsening of urinary frequency, worsening of fatigue or any concerns.  5. History of prediabetes Patient with a history of prediabetes as patient's last hemoglobin A1c done in May 2018 was 5.8 consistent with prediabetes.  Patient therefore had a repeat hemoglobin A1c done at today's visit. - HgB A1c - Comprehensive metabolic panel  6. Nocturia Patient reports that he has had recent increase in nocturia as well as noticing d very frequency for which he was provided with refill of Flomax.  Patient declines urology referral at this time ecrease in force of urinary stream and difficulty initiating urination.  Patient agrees to have PSA done as a screening test for prostate cancer.  Patient does have a history of BPH with urinary frequency.  Patient is provided refill of Flomax which he has been taking for this issue.  Patient declines urology referral at this time - PSA  7. Benign prostatic hyperplasia with urinary frequency Patient with a history of BPH and new prescription provided for Flomax.  Patient declines urology referral regarding recent  increase in urinary frequency - tamsulosin (FLOMAX) 0.4 MG CAPS capsule; Take 1 capsule (0.4 mg total) by mouth daily.  Dispense: 90 capsule; Refill: 1  8. Tobacco use Patient continues to smoke and discussed with the patient that tobacco use can accelerate vascular disease as well as lead to issues with lung disease and lung cancer and patient was advised to completely stop smoking and was made aware that there are resources available should he decide that he needs help with quitting.  Patient has been taking Wellbutrin for some time for smoking cessation but states that he now mostly takes this medication to help with anxiety  9. Tooth pain Patient with complaint of recurrent issues with tooth pain secondary to dental caries as well  as a broken tooth patient request pain medication and he is given refill of Tylenol 3 which was currently on his medication list.  Patient inquired regarding stronger medication but was told that this pharmacy/clinic only prescribes either tramadol or Tylenol 3 and he requested Tylenol 3 refill.  Patient states that he has not seen dentistry yet secondary to the cost.  Patient also provided with prescription for amoxicillin 875 mg twice daily to take if he has continued pain/gum inflammation or concerns regarding tooth infection - acetaminophen-codeine (TYLENOL #3) 300-30 MG tablet; Take 1-2 tablets by mouth every 6 (six) hours as needed.  Dispense: 30 tablet; Refill: 0 - amoxicillin (AMOXIL) 875 MG tablet; Take 1 tablet (875 mg total) by mouth 2 (two) times daily. For tooth infection; take after eating  Dispense: 20 tablet; Refill: 0  10. Anxiety Patient reports some anxiety and depression regarding his health status and financial issues.  Patient would like to continue the use of Wellbutrin to help with his anxiety and depression though he initially started this medication to help with smoking cessation.  Patient states this medicine has helped to decrease his anxiety and  depression. - buPROPion (WELLBUTRIN SR) 150 MG 12 hr tablet; Take 1 tablet (150 mg total) by mouth 2 (two) times daily.  Dispense: 180 tablet; Refill: 1  11. Insomnia, unspecified type Patient is provided refill of medication to help with insomnia.  Patient was made aware that this medication is considered a controlled substance as patient wondered why he could not get the medication filled more than 6 months at a time when he had had prescriptions with enough refills for a year in the past.  Patient was made aware that he should only take the medication when he has at least 8 hours to devote to sleep and should not operate any type of machinery after taking the medication.  Patient also made aware that this medication can cause an increased risk of falls as well as possible link to issues with dementia/memory impairment in the elderly - Eszopiclone 3 MG TABS; TAKE 1 TABLET BY MOUTH AT BEDTIME AS NEEDED SLEEP. TAKE IMMEDIATELY BEFORE BEDTIME  Dispense: 90 tablet; Refill: 1  12.  Need for immunization against influenza Patient requested and received influenza immunization at today's visit.  Patient also received handout regarding influenza immunization  An After Visit Summary was printed and given to the patient.  Allergies as of 06/27/2018   No Known Allergies     Medication List       Accurate as of June 27, 2018  6:14 PM. Always use your most recent med list.        acetaminophen-codeine 300-30 MG tablet Commonly known as:  TYLENOL #3 Take 1-2 tablets by mouth every 6 (six) hours as needed.   amoxicillin 875 MG tablet Commonly known as:  AMOXIL Take 1 tablet (875 mg total) by mouth 2 (two) times daily. For tooth infection; take after eating   aspirin 325 MG tablet Take 325 mg by mouth daily.   buPROPion 150 MG 12 hr tablet Commonly known as:  WELLBUTRIN SR Take 1 tablet (150 mg total) by mouth 2 (two) times daily.   clopidogrel 75 MG tablet Commonly known as:   PLAVIX Take 1 tablet (75 mg total) by mouth daily with breakfast.   colchicine 0.6 MG tablet Take 1 tablet (0.6 mg total) by mouth daily.   Eszopiclone 3 MG Tabs TAKE 1 TABLET BY MOUTH AT BEDTIME AS NEEDED SLEEP. TAKE IMMEDIATELY  BEFORE BEDTIME   fenofibrate 160 MG tablet Take 1 tablet (160 mg total) by mouth daily.   hydrochlorothiazide 25 MG tablet Commonly known as:  HYDRODIURIL Take 1 tablet (25 mg total) by mouth daily.   lisinopril 40 MG tablet Commonly known as:  PRINIVIL,ZESTRIL Take 1 tablet (40 mg total) by mouth daily.   metoprolol tartrate 50 MG tablet Commonly known as:  LOPRESSOR Take 1 tablet (50 mg total) by mouth 2 (two) times daily.   rosuvastatin 20 MG tablet Commonly known as:  CRESTOR Take 1 tablet (20 mg total) by mouth daily.   tamsulosin 0.4 MG Caps capsule Commonly known as:  FLOMAX Take 1 capsule (0.4 mg total) by mouth daily.       Return in about 6 months (around 12/27/2018).

## 2018-06-28 LAB — COMPREHENSIVE METABOLIC PANEL WITH GFR
ALT: 26 IU/L (ref 0–44)
AST: 21 IU/L (ref 0–40)
Albumin/Globulin Ratio: 1.5 (ref 1.2–2.2)
Albumin: 4.1 g/dL (ref 3.6–4.8)
Alkaline Phosphatase: 50 IU/L (ref 39–117)
BUN/Creatinine Ratio: 17 (ref 10–24)
BUN: 23 mg/dL (ref 8–27)
Bilirubin Total: 0.5 mg/dL (ref 0.0–1.2)
CO2: 25 mmol/L (ref 20–29)
Calcium: 10 mg/dL (ref 8.6–10.2)
Chloride: 102 mmol/L (ref 96–106)
Creatinine, Ser: 1.36 mg/dL — ABNORMAL HIGH (ref 0.76–1.27)
GFR calc Af Amer: 64 mL/min/1.73
GFR calc non Af Amer: 55 mL/min/1.73 — ABNORMAL LOW
Globulin, Total: 2.7 g/dL (ref 1.5–4.5)
Glucose: 89 mg/dL (ref 65–99)
Potassium: 4.3 mmol/L (ref 3.5–5.2)
Sodium: 143 mmol/L (ref 134–144)
Total Protein: 6.8 g/dL (ref 6.0–8.5)

## 2018-06-28 LAB — LIPID PANEL
Chol/HDL Ratio: 5.2 ratio — ABNORMAL HIGH (ref 0.0–5.0)
Cholesterol, Total: 202 mg/dL — ABNORMAL HIGH (ref 100–199)
HDL: 39 mg/dL — ABNORMAL LOW
LDL Calculated: 132 mg/dL — ABNORMAL HIGH (ref 0–99)
Triglycerides: 155 mg/dL — ABNORMAL HIGH (ref 0–149)
VLDL Cholesterol Cal: 31 mg/dL (ref 5–40)

## 2018-06-29 LAB — SPECIMEN STATUS REPORT

## 2018-06-29 LAB — PSA: Prostate Specific Ag, Serum: 4 ng/mL (ref 0.0–4.0)

## 2018-07-03 ENCOUNTER — Other Ambulatory Visit: Payer: Self-pay | Admitting: Family Medicine

## 2018-07-03 ENCOUNTER — Telehealth (INDEPENDENT_AMBULATORY_CARE_PROVIDER_SITE_OTHER): Payer: Self-pay | Admitting: *Deleted

## 2018-07-03 DIAGNOSIS — R351 Nocturia: Principal | ICD-10-CM

## 2018-07-03 DIAGNOSIS — N401 Enlarged prostate with lower urinary tract symptoms: Secondary | ICD-10-CM

## 2018-07-03 DIAGNOSIS — R350 Frequency of micturition: Secondary | ICD-10-CM

## 2018-07-03 NOTE — Telephone Encounter (Signed)
Patient verified DOB Patient is aware of PSA being at the top of normal range and is willing to see the urologist. Patient is aware of keeping with cholesterol medication and the improvement in his kidney function and cholesterol. NO further questions.

## 2018-07-03 NOTE — Telephone Encounter (Signed)
-----   Message from Antony Blackbird, MD sent at 07/02/2018 11:55 PM EST ----- PSA is at 4 and normal level is 0.0-4.0.Marland Kitchen I would recommend a referral to Urology. On CMP, Creatinine is increased at 1.36 but is improved from level 1 year ago on prior labs. Lipid panel with TG of 155 (goal is 140 or less) and improved from 364 one year ago. LDL, bad cholesterol is increased at 132 (goal 100 or less). Continue use of crestor and fenofibrate at this time along with a healthy diet and exercise as tolerated

## 2018-07-03 NOTE — Telephone Encounter (Signed)
Urology referral order placed

## 2018-07-03 NOTE — Progress Notes (Signed)
Patient ID: [name], male   DOB: 07-19-1955, 62 y.o.   MRN: [id number]   Patient with history of BPH and patient reports that he has had recent increase in urinary frequency/nocturia.  Patient is currently on Flomax.  Patient had PSA at his recent visit on 06/27/2018 with a value of 4.0.  Patient was contacted with results and patient agrees to have referral to urology

## 2018-07-29 MED FILL — CLOPIDOGREL 75 MG TABLET: 75 | 90 days supply | Qty: 90 | Fill #1

## 2018-07-29 MED FILL — TAMSULOSIN HCL 0.4 MG CAP: 0.4 | 90 days supply | Qty: 90 | Fill #1

## 2018-07-29 MED FILL — HYDROCHLOROTHIAZIDE 25 MG T: 25 | 90 days supply | Qty: 90 | Fill #1

## 2018-07-29 MED FILL — LISINOPRIL 40 MG TABLET: 40 | 90 days supply | Qty: 90 | Fill #1

## 2018-07-29 MED FILL — BUPROPION SR 150 MG TABLET: 150 | 90 days supply | Qty: 180 | Fill #1

## 2018-07-29 MED FILL — ROSUVASTATIN CALCIUM 20 MG: 20 | 90 days supply | Qty: 90 | Fill #1

## 2018-11-04 ENCOUNTER — Other Ambulatory Visit: Payer: Self-pay | Admitting: Family Medicine

## 2018-11-04 DIAGNOSIS — I2510 Atherosclerotic heart disease of native coronary artery without angina pectoris: Secondary | ICD-10-CM

## 2019-04-29 ENCOUNTER — Ambulatory Visit: Payer: Self-pay | Attending: Family Medicine | Admitting: Physician Assistant

## 2019-04-29 ENCOUNTER — Other Ambulatory Visit: Payer: Self-pay

## 2019-04-29 DIAGNOSIS — N401 Enlarged prostate with lower urinary tract symptoms: Secondary | ICD-10-CM

## 2019-04-29 DIAGNOSIS — G8929 Other chronic pain: Secondary | ICD-10-CM

## 2019-04-29 DIAGNOSIS — E782 Mixed hyperlipidemia: Secondary | ICD-10-CM

## 2019-04-29 DIAGNOSIS — M545 Low back pain, unspecified: Secondary | ICD-10-CM

## 2019-04-29 DIAGNOSIS — K0889 Other specified disorders of teeth and supporting structures: Secondary | ICD-10-CM

## 2019-04-29 DIAGNOSIS — R350 Frequency of micturition: Secondary | ICD-10-CM

## 2019-04-29 DIAGNOSIS — F419 Anxiety disorder, unspecified: Secondary | ICD-10-CM

## 2019-04-29 DIAGNOSIS — I10 Essential (primary) hypertension: Secondary | ICD-10-CM

## 2019-04-29 DIAGNOSIS — I2510 Atherosclerotic heart disease of native coronary artery without angina pectoris: Secondary | ICD-10-CM

## 2019-04-29 DIAGNOSIS — Z87898 Personal history of other specified conditions: Secondary | ICD-10-CM

## 2019-04-29 MED ORDER — GABAPENTIN 100 MG PO CAPS: 100.0000 mg | ORAL_CAPSULE | Freq: Three times a day (TID) | ORAL | 1 refills | Status: DC

## 2019-04-29 MED ORDER — TAMSULOSIN HCL 0.4 MG PO CAPS: 0.4000 mg | ORAL_CAPSULE | Freq: Every day | ORAL | 1 refills | Status: DC

## 2019-04-29 MED ORDER — CLOPIDOGREL BISULFATE 75 MG PO TABS: 75.0000 mg | ORAL_TABLET | Freq: Every day | ORAL | 1 refills | Status: DC

## 2019-04-29 MED ORDER — FENOFIBRATE 160 MG PO TABS: 160.0000 mg | ORAL_TABLET | Freq: Every day | ORAL | 1 refills | Status: DC

## 2019-04-29 MED ORDER — METOPROLOL TARTRATE 50 MG PO TABS: 50.0000 mg | ORAL_TABLET | Freq: Two times a day (BID) | ORAL | 3 refills | Status: DC

## 2019-04-29 MED ORDER — BUPROPION HCL ER (SR) 150 MG PO TB12: 150.0000 mg | ORAL_TABLET | Freq: Two times a day (BID) | ORAL | 1 refills | Status: DC

## 2019-04-29 MED ORDER — COLCHICINE 0.6 MG PO TABS: 0.6000 mg | ORAL_TABLET | Freq: Every day | ORAL | 3 refills | Status: DC

## 2019-04-29 MED ORDER — ACETAMINOPHEN-CODEINE #3 300-30 MG PO TABS: 1.0000 | ORAL_TABLET | Freq: Four times a day (QID) | ORAL | 0 refills | Status: DC | PRN

## 2019-04-29 MED ORDER — CYCLOBENZAPRINE HCL 10 MG PO TABS: 10.0000 mg | ORAL_TABLET | Freq: Three times a day (TID) | ORAL | 1 refills | Status: DC | PRN

## 2019-04-29 MED ORDER — HYDROCHLOROTHIAZIDE 25 MG PO TABS: 25.0000 mg | ORAL_TABLET | Freq: Every day | ORAL | 1 refills | Status: DC

## 2019-04-29 MED ORDER — LISINOPRIL 40 MG PO TABS: 40.0000 mg | ORAL_TABLET | Freq: Every day | ORAL | 1 refills | Status: DC

## 2019-04-29 MED ORDER — ROSUVASTATIN CALCIUM 20 MG PO TABS: 20.0000 mg | ORAL_TABLET | Freq: Every day | ORAL | 1 refills | Status: DC

## 2019-04-29 MED FILL — !COLCRYS 0.6 MG TABLET: 0.6 MG | 10 days supply | Qty: 10 | Fill #0

## 2019-04-29 MED FILL — LISINOPRIL 40 MG TABLET: 40 | 90 days supply | Qty: 90 | Fill #0

## 2019-04-29 MED FILL — CLOPIDOGREL 75 MG TABLET: 75 | 90 days supply | Qty: 90 | Fill #0

## 2019-04-29 MED FILL — TAMSULOSIN HCL 0.4 MG CAP: 0.4 | 90 days supply | Qty: 90 | Fill #0

## 2019-04-29 MED FILL — FENOFIBRATE 160 MG TABLET: 160 | 90 days supply | Qty: 90 | Fill #0

## 2019-04-29 MED FILL — ACETAMINOPHEN/COD #3 TABLET: 300-30 | 3 days supply | Qty: 15 | Fill #0

## 2019-04-29 MED FILL — HYDROCHLOROTHIAZIDE 25 MG T: 25 | 90 days supply | Qty: 90 | Fill #0

## 2019-04-29 MED FILL — BUPROPION SR 150 MG TABLET: 150 | 90 days supply | Qty: 180 | Fill #0

## 2019-04-29 MED FILL — METOPROLOL TARTRATE 50 MG T: 50 | 90 days supply | Qty: 180 | Fill #0

## 2019-04-29 MED FILL — ROSUVASTATIN CALCIUM 20 MG: 20 | 90 days supply | Qty: 90 | Fill #0

## 2019-04-29 MED FILL — CYCLOBENZAPRINE 10 MG TAB: 10 | 30 days supply | Qty: 90 | Fill #0

## 2019-04-29 MED FILL — GABAPENTIN 100 MG CAP: 100 | 90 days supply | Qty: 270 | Fill #0

## 2019-04-29 NOTE — Progress Notes (Signed)
Virtual Visit via Telephone Note  I connected with Brinson Tozzi on 04/29/19 at  2:30 PM EDT by telephone and verified that I am speaking with the correct person using two identifiers.   I discussed the limitations, risks, security and privacy concerns of performing an evaluation and management service by telephone and the availability of in person appointments. I also discussed with the patient that there may be a patient responsible charge related to this service. The patient expressed understanding and agreed to proceed.  Patient location:  home My Location:  [place] office Persons on the call:  Me and the patient.     History of Present Illness:  Patient states that he lives 2 hours from here and was seen in the ED and diagnosed with an L5-S1 problem and has an ortho appt on 11/19.  He says they gave him tramadol but not enough to last until then and he is asking if he can have a few tylenol #3 to bridge the gap.  He also needs all of his other meds RF and is asking me for a 90 day supply with 1 year RF but hasn't been to our office in almost a year.      Observations/Objective:  NAD.  Demanding at times.  A&Ox3  Assessment and Plan: 1. Essential hypertension - Comprehensive metabolic panel; Future - CBC with Differential/Platelet; Future - lisinopril (ZESTRIL) 40 MG tablet; Take 1 tablet (40 mg total) by mouth daily.  Dispense: 90 tablet; Refill: 1 - hydrochlorothiazide (HYDRODIURIL) 25 MG tablet; Take 1 tablet (25 mg total) by mouth daily.  Dispense: 90 tablet; Refill: 1 - metoprolol tartrate (LOPRESSOR) 50 MG tablet; Take 1 tablet (50 mg total) by mouth 2 (two) times daily.  Dispense: 180 tablet; Refill: 3  2. History of prediabetes I have had a lengthy discussion and provided education about insulin resistance and the intake of too much sugar/refined carbohydrates.  I have advised the patient to work at a goal of eliminating sugary drinks, candy, desserts, sweets, refined sugars,  processed foods, and white carbohydrates.  The patient expresses understanding.     3. Benign prostatic hyperplasia with urinary frequency - PSA; Future - tamsulosin (FLOMAX) 0.4 MG CAPS capsule; Take 1 capsule (0.4 mg total) by mouth daily.  Dispense: 90 capsule; Refill: 1  4. Mixed hyperlipidemia - Lipid panel; Future - rosuvastatin (CRESTOR) 20 MG tablet; Take 1 tablet (20 mg total) by mouth daily.  Dispense: 90 tablet; Refill: 1  5. Arteriosclerotic coronary artery disease - fenofibrate 160 MG tablet; Take 1 tablet (160 mg total) by mouth daily.  Dispense: 90 tablet; Refill: 1 - colchicine 0.6 MG tablet; Take 1 tablet (0.6 mg total) by mouth daily.  Dispense: 90 tablet; Refill: 3 - clopidogrel (PLAVIX) 75 MG tablet; Take 1 tablet (75 mg total) by mouth daily with breakfast.  Dispense: 90 tablet; Refill: 1 - lisinopril (ZESTRIL) 40 MG tablet; Take 1 tablet (40 mg total) by mouth daily.  Dispense: 90 tablet; Refill: 1 - rosuvastatin (CRESTOR) 20 MG tablet; Take 1 tablet (20 mg total) by mouth daily.  Dispense: 90 tablet; Refill: 1  6. Anxiety - buPROPion (WELLBUTRIN SR) 150 MG 12 hr tablet; Take 1 tablet (150 mg total) by mouth 2 (two) times daily.  Dispense: 180 tablet; Refill: 1  7. Tooth pain - acetaminophen-codeine (TYLENOL #3) 300-30 MG tablet; Take 1-2 tablets by mouth every 6 (six) hours as needed.  Dispense: 15 tablet; Refill: 0  8. Chronic low back pain, unspecified  back pain laterality, unspecified whether sciatica present - cyclobenzaprine (FLEXERIL) 10 MG tablet; Take 1 tablet (10 mg total) by mouth 3 (three) times daily as needed for muscle spasms.  Dispense: 90 tablet; Refill: 1 - acetaminophen-codeine (TYLENOL #3) 300-30 MG tablet; Take 1-2 tablets by mouth every 6 (six) hours as needed.  Dispense: 15 tablet; Refill: 0    Follow Up Instructions: See PCP in 4-6 months    I discussed the assessment and treatment plan with the patient. The patient was provided an  opportunity to ask questions and all were answered. The patient agreed with the plan and demonstrated an understanding of the instructions.   The patient was advised to call back or seek an in-person evaluation if the symptoms worsen or if the condition fails to improve as anticipated.  I provided 17 minutes of non-face-to-face time during this encounter.   Freeman Caldron, PA-C  Patient ID: [name], male   DOB: 03-11-1956, 63 y.o.   MRN: [id number]

## 2019-04-29 NOTE — Progress Notes (Signed)
Hospital f/u for lower back and was given Tramadol and now he's out and this was the first time he went   They diagnosis with L5-S1  Per pt he is needing referral to Pain management   Med refills(90 days at a time with 3 refills)

## 2019-05-06 ENCOUNTER — Other Ambulatory Visit: Payer: Self-pay

## 2019-05-06 ENCOUNTER — Ambulatory Visit: Payer: Self-pay | Attending: Family Medicine

## 2019-05-06 ENCOUNTER — Ambulatory Visit (HOSPITAL_BASED_OUTPATIENT_CLINIC_OR_DEPARTMENT_OTHER): Payer: Self-pay | Admitting: Pharmacist

## 2019-05-06 DIAGNOSIS — E782 Mixed hyperlipidemia: Secondary | ICD-10-CM

## 2019-05-06 DIAGNOSIS — N401 Enlarged prostate with lower urinary tract symptoms: Secondary | ICD-10-CM

## 2019-05-06 DIAGNOSIS — I10 Essential (primary) hypertension: Secondary | ICD-10-CM

## 2019-05-06 DIAGNOSIS — R350 Frequency of micturition: Secondary | ICD-10-CM

## 2019-05-06 DIAGNOSIS — Z23 Encounter for immunization: Secondary | ICD-10-CM

## 2019-05-06 NOTE — Progress Notes (Signed)
Patient presents for vaccination against influenza and tetanus per orders of Dr. Chapman Fitch. Consent given. Counseling provided. No contraindications exists. Vaccine administered without incident.

## 2019-05-07 LAB — COMPREHENSIVE METABOLIC PANEL
ALT: 14 IU/L (ref 0–44)
AST: 13 IU/L (ref 0–40)
Albumin/Globulin Ratio: 1.8 (ref 1.2–2.2)
Albumin: 4.2 g/dL (ref 3.8–4.8)
Alkaline Phosphatase: 57 IU/L (ref 39–117)
BUN/Creatinine Ratio: 19 (ref 10–24)
BUN: 25 mg/dL (ref 8–27)
Bilirubin Total: 0.3 mg/dL (ref 0.0–1.2)
CO2: 27 mmol/L (ref 20–29)
Calcium: 9.7 mg/dL (ref 8.6–10.2)
Chloride: 102 mmol/L (ref 96–106)
Creatinine, Ser: 1.29 mg/dL — ABNORMAL HIGH (ref 0.76–1.27)
GFR calc Af Amer: 68 mL/min/{1.73_m2} (ref >59)
GFR calc non Af Amer: 59 mL/min/{1.73_m2} — ABNORMAL LOW (ref >59)
Globulin, Total: 2.4 g/dL (ref 1.5–4.5)
Glucose: 70 mg/dL (ref 65–99)
Potassium: 4.3 mmol/L (ref 3.5–5.2)
Sodium: 143 mmol/L (ref 134–144)
Total Protein: 6.6 g/dL (ref 6.0–8.5)

## 2019-05-07 LAB — CBC WITH DIFFERENTIAL/PLATELET
Basophils Absolute: 0.1 10*3/uL (ref 0.0–0.2)
Basos: 1 %
EOS (ABSOLUTE): 0.4 10*3/uL (ref 0.0–0.4)
Eos: 3 %
Hematocrit: 49.9 % (ref 37.5–51.0)
Hemoglobin: 16.6 g/dL (ref 13.0–17.7)
Immature Grans (Abs): 0.2 10*3/uL — ABNORMAL HIGH (ref 0.0–0.1)
Immature Granulocytes: 2 %
Lymphocytes Absolute: 2.8 10*3/uL (ref 0.7–3.1)
Lymphs: 22 %
MCH: 30.5 pg (ref 26.6–33.0)
MCHC: 33.3 g/dL (ref 31.5–35.7)
MCV: 92 fL (ref 79–97)
Monocytes Absolute: 1.4 10*3/uL — ABNORMAL HIGH (ref 0.1–0.9)
Monocytes: 10 %
Neutrophils Absolute: 8.2 10*3/uL — ABNORMAL HIGH (ref 1.4–7.0)
Neutrophils: 62 %
Platelets: 279 10*3/uL (ref 150–450)
RBC: 5.44 x10E6/uL (ref 4.14–5.80)
RDW: 12.6 % (ref 11.6–15.4)
WBC: 13.1 10*3/uL — ABNORMAL HIGH (ref 3.4–10.8)

## 2019-05-07 LAB — PSA: Prostate Specific Ag, Serum: 4 ng/mL (ref 0.0–4.0)

## 2019-05-07 LAB — LIPID PANEL
Chol/HDL Ratio: 4.7 ratio (ref 0.0–5.0)
Cholesterol, Total: 175 mg/dL (ref 100–199)
HDL: 37 mg/dL — ABNORMAL LOW (ref >39)
LDL Chol Calc (NIH): 92 mg/dL (ref 0–99)
Triglycerides: 273 mg/dL — ABNORMAL HIGH (ref 0–149)
VLDL Cholesterol Cal: 46 mg/dL — ABNORMAL HIGH (ref 5–40)

## 2019-05-08 ENCOUNTER — Telehealth: Payer: Self-pay | Admitting: *Deleted

## 2019-05-08 NOTE — Telephone Encounter (Signed)
Spoke with patient and informed him with his results and patient verbalized understanding.

## 2019-07-23 ENCOUNTER — Encounter: Payer: Self-pay | Admitting: *Deleted

## 2019-07-24 ENCOUNTER — Telehealth: Payer: Self-pay | Admitting: Physician Assistant

## 2019-07-24 NOTE — Telephone Encounter (Signed)
Our office received a new pt referral from [facility] for dx of metastatic lung cancer. I cld and spoke to Duncan at [facility] to provide an appt for Mr. Pavon to see Cassi on 1/20 at 130pm w/labs at 1pm.  Lattie Haw will give the appt date and time to the pt.

## 2019-07-28 ENCOUNTER — Other Ambulatory Visit: Payer: Self-pay | Admitting: Physician Assistant

## 2019-07-28 ENCOUNTER — Telehealth: Payer: Self-pay | Admitting: Radiation Oncology

## 2019-07-28 DIAGNOSIS — R918 Other nonspecific abnormal finding of lung field: Secondary | ICD-10-CM

## 2019-07-28 NOTE — Telephone Encounter (Signed)
New message:    LVM for patient to return call to schedule appt from referral received.

## 2019-07-29 ENCOUNTER — Inpatient Hospital Stay: Payer: Managed Care, Other (non HMO) | Attending: Physician Assistant | Admitting: Physician Assistant

## 2019-07-29 ENCOUNTER — Telehealth: Payer: Self-pay

## 2019-07-29 ENCOUNTER — Inpatient Hospital Stay: Payer: Managed Care, Other (non HMO)

## 2019-07-29 NOTE — Telephone Encounter (Signed)
Left VM for patient regarding missed appointment today and asked for a call back to reschedule and checking on patient in general.

## 2019-07-31 ENCOUNTER — Telehealth: Payer: Self-pay | Admitting: *Deleted

## 2019-07-31 ENCOUNTER — Telehealth: Payer: Self-pay | Admitting: Physician Assistant

## 2019-07-31 NOTE — Telephone Encounter (Signed)
I cld and spoke to the pt's daughter and rescheduled [name] appt to see [name] on 1/25 at 1:30pm w/labs at 1pm. Pt's ex-wife will be bringing him to the appt. I informed pt's daughter that one person may attend the visit.

## 2019-07-31 NOTE — Telephone Encounter (Signed)
Oncology Nurse Navigator Documentation  Oncology Nurse Navigator Flowsheets 07/31/2019  Navigator Location [facility]  Navigator Encounter Type Telephone/I called patient to re-schedule.  I was unable to reach or leave vm message.  I called referring facility to see if they could reach him to let him know we are trying to re-schedule him.  I spoke with Lattie Haw at [facility] and she will update her supervisor on issue.    Telephone Outgoing Call  Barriers/Navigation Needs Coordination of Care;Education  Education Other  Interventions Coordination of Care;Education  Acuity Level 2-Minimal Needs (1-2 Barriers Identified)  Coordination of Care Other  Time Spent with Patient 30

## 2019-08-01 ENCOUNTER — Inpatient Hospital Stay (HOSPITAL_COMMUNITY)
Admission: EM | Admit: 2019-08-01 | Discharge: 2019-08-04 | DRG: 948 | Disposition: A | Discharge status: Hospice | Payer: Managed Care, Other (non HMO) | Attending: Internal Medicine | Admitting: Internal Medicine

## 2019-08-01 ENCOUNTER — Other Ambulatory Visit: Payer: Self-pay

## 2019-08-01 DIAGNOSIS — N401 Enlarged prostate with lower urinary tract symptoms: Secondary | ICD-10-CM | POA: Diagnosis present

## 2019-08-01 DIAGNOSIS — Z515 Encounter for palliative care: Secondary | ICD-10-CM | POA: Diagnosis not present

## 2019-08-01 DIAGNOSIS — C7951 Secondary malignant neoplasm of bone: Secondary | ICD-10-CM | POA: Diagnosis present

## 2019-08-01 DIAGNOSIS — M109 Gout, unspecified: Secondary | ICD-10-CM | POA: Diagnosis present

## 2019-08-01 DIAGNOSIS — Z79891 Long term (current) use of opiate analgesic: Secondary | ICD-10-CM

## 2019-08-01 DIAGNOSIS — Z7189 Other specified counseling: Secondary | ICD-10-CM

## 2019-08-01 DIAGNOSIS — I2510 Atherosclerotic heart disease of native coronary artery without angina pectoris: Secondary | ICD-10-CM | POA: Diagnosis present

## 2019-08-01 DIAGNOSIS — Z955 Presence of coronary angioplasty implant and graft: Secondary | ICD-10-CM

## 2019-08-01 DIAGNOSIS — E7800 Pure hypercholesterolemia, unspecified: Secondary | ICD-10-CM | POA: Diagnosis present

## 2019-08-01 DIAGNOSIS — R350 Frequency of micturition: Secondary | ICD-10-CM | POA: Diagnosis present

## 2019-08-01 DIAGNOSIS — C787 Secondary malignant neoplasm of liver and intrahepatic bile duct: Secondary | ICD-10-CM | POA: Diagnosis present

## 2019-08-01 DIAGNOSIS — S32050A Wedge compression fracture of fifth lumbar vertebra, initial encounter for closed fracture: Secondary | ICD-10-CM

## 2019-08-01 DIAGNOSIS — Z7902 Long term (current) use of antithrombotics/antiplatelets: Secondary | ICD-10-CM

## 2019-08-01 DIAGNOSIS — R32 Unspecified urinary incontinence: Secondary | ICD-10-CM | POA: Diagnosis present

## 2019-08-01 DIAGNOSIS — G893 Neoplasm related pain (acute) (chronic): Principal | ICD-10-CM | POA: Diagnosis present

## 2019-08-01 DIAGNOSIS — Z7982 Long term (current) use of aspirin: Secondary | ICD-10-CM

## 2019-08-01 DIAGNOSIS — S22000A Wedge compression fracture of unspecified thoracic vertebra, initial encounter for closed fracture: Secondary | ICD-10-CM

## 2019-08-01 DIAGNOSIS — C3490 Malignant neoplasm of unspecified part of unspecified bronchus or lung: Secondary | ICD-10-CM | POA: Diagnosis present

## 2019-08-01 DIAGNOSIS — Z20822 Contact with and (suspected) exposure to covid-19: Secondary | ICD-10-CM | POA: Diagnosis present

## 2019-08-01 DIAGNOSIS — E782 Mixed hyperlipidemia: Secondary | ICD-10-CM | POA: Diagnosis present

## 2019-08-01 DIAGNOSIS — C7931 Secondary malignant neoplasm of brain: Secondary | ICD-10-CM | POA: Diagnosis present

## 2019-08-01 DIAGNOSIS — Z8249 Family history of ischemic heart disease and other diseases of the circulatory system: Secondary | ICD-10-CM

## 2019-08-01 DIAGNOSIS — I10 Essential (primary) hypertension: Secondary | ICD-10-CM | POA: Diagnosis present

## 2019-08-01 DIAGNOSIS — Z66 Do not resuscitate: Secondary | ICD-10-CM | POA: Diagnosis not present

## 2019-08-01 DIAGNOSIS — R159 Full incontinence of feces: Secondary | ICD-10-CM | POA: Diagnosis present

## 2019-08-01 DIAGNOSIS — Z79899 Other long term (current) drug therapy: Secondary | ICD-10-CM

## 2019-08-01 DIAGNOSIS — C7989 Secondary malignant neoplasm of other specified sites: Secondary | ICD-10-CM | POA: Diagnosis present

## 2019-08-01 DIAGNOSIS — R338 Other retention of urine: Secondary | ICD-10-CM | POA: Diagnosis present

## 2019-08-01 DIAGNOSIS — F17210 Nicotine dependence, cigarettes, uncomplicated: Secondary | ICD-10-CM | POA: Diagnosis present

## 2019-08-01 MED ORDER — MORPHINE SULFATE (PF) 4 MG/ML IV SOLN
4.0000 mg | Freq: Once | INTRAVENOUS | Status: AC
  Administered 2019-08-01: 4 mg via INTRAVENOUS
  Filled 2019-08-01: qty 1

## 2019-08-01 MED ORDER — ONDANSETRON HCL 4 MG/2ML IJ SOLN
4.0000 mg | Freq: Once | INTRAMUSCULAR | Status: AC
  Administered 2019-08-01: 4 mg via INTRAVENOUS
  Filled 2019-08-01: qty 2

## 2019-08-01 MED ORDER — NALOXONE HCL 2 MG/2ML IJ SOSY: 1.0000 mg | PREFILLED_SYRINGE | Freq: Once | INTRAMUSCULAR | Status: DC

## 2019-08-01 NOTE — ED Provider Notes (Signed)
TIME SEEN: 11:28 PM  CHIEF COMPLAINT: Back pain  HPI: Patient is a 64 year old male with history of hypertension, hyperlipidemia, CAD status post 2 stents, history of metastatic lung cancer that has metastasized to liver, kidney, thyroid, bone and brain who finished palliative radiation Friday the 15th per his ex-wife who presents today with complaints of back pain.  Patient has a very hard time answering questions and seems confused.  He states that he is living in a camper alone.  He states that he is on OxyContin and hydrocodone and thinks he took hydrocodone prior to arrival.  He is not sure who his oncologist is.  He denies any history of back surgery or epidural injections.  States he is always had a hard time emptying his bladder because of prostate issues.  No urinary incontinence.  Did have an episode where he had a bowel movement on himself but states that he thinks it is because he could not get to the bathroom in time rather than true incontinence.  Denies any numbness or weakness.  Denies any known injury to the back.  Patient denies any fever.  States he thinks the pain started last week but he is not sure.  Spoke to his ex-wife Fraser Din.  She states that patient has not living on his own but has been moved from [facility] here to [facility] and has been living with his daughter.  She states that he has been told that he has less than 2 months to live.  He is seeing Dr. Julien Nordmann with [facility] for the first time on Monday the 25th.  She reports that he has been confused today which is new for him.  She reports he is on morphine and oxycodone but is not sure if he had pain medication prior to arrival.  Caryl Pina - daughter - [phone]  ROS: Level 5 caveat secondary to confusion  PAST MEDICAL HISTORY/PAST SURGICAL HISTORY:  Past Medical History:  Diagnosis Date  . Coronary artery disease   . Gout   . High cholesterol   . Hypertension     MEDICATIONS:   Prior to Admission medications   Medication Sig Start Date End Date Taking? Authorizing Provider  acetaminophen-codeine (TYLENOL #3) 300-30 MG tablet Take 1-2 tablets by mouth every 6 (six) hours as needed. 04/29/19   Argentina Donovan, PA-C  amoxicillin (AMOXIL) 875 MG tablet Take 1 tablet (875 mg total) by mouth 2 (two) times daily. For tooth infection; take after eating Patient not taking: Reported on 04/29/2019 06/27/18   Antony Blackbird, MD  aspirin 325 MG tablet Take 325 mg by mouth daily.    [provider]  buPROPion (WELLBUTRIN SR) 150 MG 12 hr tablet Take 1 tablet (150 mg total) by mouth 2 (two) times daily. 04/29/19   Argentina Donovan, PA-C  clopidogrel (PLAVIX) 75 MG tablet Take 1 tablet (75 mg total) by mouth daily with breakfast. 04/29/19   Argentina Donovan, PA-C  colchicine 0.6 MG tablet Take 1 tablet (0.6 mg total) by mouth daily. 04/29/19   Argentina Donovan, PA-C  cyclobenzaprine (FLEXERIL) 10 MG tablet Take 1 tablet (10 mg total) by mouth 3 (three) times daily as needed for muscle spasms. 04/29/19   Argentina Donovan, PA-C  Eszopiclone 3 MG TABS TAKE 1 TABLET BY MOUTH AT BEDTIME AS NEEDED SLEEP. TAKE IMMEDIATELY BEFORE BEDTIME 06/27/18   Fulp, Cammie, MD  fenofibrate 160 MG tablet Take 1 tablet (160 mg total) by mouth  daily. 04/29/19   Argentina Donovan, PA-C  gabapentin (NEURONTIN) 100 MG capsule Take 1 capsule (100 mg total) by mouth 3 (three) times daily. 04/29/19   Argentina Donovan, PA-C  hydrochlorothiazide (HYDRODIURIL) 25 MG tablet -Take 1 tablet (25 mg total) by mouth daily. 04/29/19   Argentina Donovan, PA-C  lisinopril (ZESTRIL) 40 MG tablet Take 1 tablet (40 mg total) by mouth daily. 04/29/19   Argentina Donovan, PA-C  methylPREDNISolone (MEDROL) 4 MG tablet Take 4 mg by mouth daily.    [provider]  metoprolol tartrate (LOPRESSOR) 50 MG tablet Take 1 tablet (50 mg total) by mouth 2 (two) times daily. 04/29/19   Argentina Donovan, PA-C  oxyCODONE  HCl (OXYCONTIN PO) Take by mouth. 10/325mg     [provider]  rosuvastatin (CRESTOR) 20 MG tablet Take 1 tablet (20 mg total) by mouth daily. 04/29/19   Argentina Donovan, PA-C  tamsulosin (FLOMAX) 0.4 MG CAPS capsule Take 1 capsule (0.4 mg total) by mouth daily. 04/29/19   Argentina Donovan, PA-C    ALLERGIES:  No Known Allergies  SOCIAL HISTORY:  Social History   Tobacco Use  . Smoking status: Current Every Day Smoker    Packs/day: 0.50  . Smokeless tobacco: Current User  Substance Use Topics  . Alcohol use: Yes    FAMILY HISTORY: Family History  Problem Relation Age of Onset  . Hypertension Father     EXAM: BP 131/72 (BP Location: Left Arm)   Pulse 100   Temp 98.1 F (36.7 C) (Oral)   Resp 16   SpO2 94%  CONSTITUTIONAL: Alert and oriented to person but has a hard time answering questions appropriately.  Patient is confused.  Appears older than stated age.  Appears uncomfortable. HEAD: Normocephalic EYES: Conjunctivae clear, pupils appear equal, EOM appear intact ENT: normal nose; moist mucous membranes NECK: Supple, normal ROM CARD: RRR; S1 and S2 appreciated; no murmurs, no clicks, no rubs, no gallops RESP: Normal chest excursion without splinting or tachypnea; breath sounds clear and equal bilaterally; no wheezes, no rhonchi, no rales, no hypoxia or respiratory distress, speaking full sentences ABD/GI: Normal bowel sounds; non-distended; soft, non-tender, no rebound, no guarding, no peritoneal signs, no hepatosplenomegaly BACK:  The back appears normal, tender over the thoracic and lumbar spine, no redness or warmth, no ecchymosis or swelling, no step-off or deformity EXT: Normal ROM in all joints; no deformity noted, no edema; no cyanosis, muscle wasting in bilateral lower extremities SKIN: Normal color for age and race; warm; no rash on exposed skin NEURO: Moves all extremities equally, reports normal sensation diffusely, has a difficult time lifting either  leg off the bed secondary to back pain but has normal strength with plantar and dorsiflexion.  He has normal rectal tone.  Normal speech. PSYCH: The patient's mood and manner are appropriate.   MEDICAL DECISION MAKING: Patient here with increasing back pain.  After discussion with his ex-wife Fraser Din it appears that patient has end-stage metastatic lung cancer.  We did have a lengthy discussion that primary goals of care at this time would be pain control.  Low suspicion for any neurologic emergency currently but we discussed that patient would not be a candidate for operative management if he did have conditions such as cauda equina, epidural abscess or hematoma given his extremely poor prognosis and ex-wife agrees.  Will obtain labs, urine, x-rays of the spine, CT of the head given his confusion.  Will provide with IV morphine, Zofran.  ED PROGRESS: Patient has had no pain control after morphine, 2 rounds of Dilaudid.  Unable to sit up in the bed or ambulate.  I have had lengthy discussion with patient's ex-wife Fraser Din who is his power of attorney.  She agrees that patient needs hospice and they would not warrant emergent neurosurgical treatment even if this was an emergent neurosurgical issue given his extremely poor prognosis.  Will admit to hospitalist for pain control and hospice/palliative care.  Labs here are unremarkable other than thrombocytopenia with platelet count of 110,000.  CT of the head shows no acute abnormality.  X-rays show T3 and T6 vertebral body lesions with mild T3 compression fracture.  Also has compression fracture of the L5 vertebral body that are is moderate in nature.  These findings appear similar to previous imaging.  Patient unable to urinate here in the ED.  He reports history of BPH and wife confirms this.  Unable to tolerate catheterization here in the emergency department.  3:49 AM Discussed patient's case with hospitalist, Dr. Alcario Drought.  I have recommended admission and  patient (and family if present) agree with this plan. Admitting physician will place admission orders.    Patient is now more alert.  He reports he is a DNR/DNI to the hospitalist and states he is aware he is dying.  Agrees with no emergent intervention and admission for hospice.   I reviewed all nursing notes, vitals, pertinent previous records and interpreted all EKGs, lab and urine results, imaging (as available).     [name] was evaluated in Emergency Department on 08/01/2019 for the symptoms described in the history of present illness. He was evaluated in the context of the global COVID-19 pandemic, which necessitated consideration that the patient might be at risk for infection with the SARS-CoV-2 virus that causes COVID-19. Institutional protocols and algorithms that pertain to the evaluation of patients at risk for COVID-19 are in a state of rapid change based on information released by regulatory bodies including the CDC and federal and state organizations. These policies and algorithms were followed during the patient's care in the ED.  Patient was seen wearing N95, face shield, gloves.    Christopher Hink, Delice Bison, DO 08/02/19 [phone]

## 2019-08-01 NOTE — ED Triage Notes (Addendum)
Patient brought in by EMS from home complain of Back pain. Per pt back pain got worsen tonight even with PRN pain meds. No reports of Fever.

## 2019-08-02 ENCOUNTER — Emergency Department (HOSPITAL_COMMUNITY): Payer: Managed Care, Other (non HMO)

## 2019-08-02 DIAGNOSIS — I10 Essential (primary) hypertension: Secondary | ICD-10-CM

## 2019-08-02 DIAGNOSIS — Z7982 Long term (current) use of aspirin: Secondary | ICD-10-CM | POA: Diagnosis not present

## 2019-08-02 DIAGNOSIS — Z79891 Long term (current) use of opiate analgesic: Secondary | ICD-10-CM | POA: Diagnosis not present

## 2019-08-02 DIAGNOSIS — R338 Other retention of urine: Secondary | ICD-10-CM | POA: Diagnosis present

## 2019-08-02 DIAGNOSIS — E782 Mixed hyperlipidemia: Secondary | ICD-10-CM | POA: Diagnosis present

## 2019-08-02 DIAGNOSIS — R350 Frequency of micturition: Secondary | ICD-10-CM

## 2019-08-02 DIAGNOSIS — Z66 Do not resuscitate: Secondary | ICD-10-CM

## 2019-08-02 DIAGNOSIS — C7931 Secondary malignant neoplasm of brain: Secondary | ICD-10-CM | POA: Diagnosis present

## 2019-08-02 DIAGNOSIS — Z7902 Long term (current) use of antithrombotics/antiplatelets: Secondary | ICD-10-CM | POA: Diagnosis not present

## 2019-08-02 DIAGNOSIS — S32050A Wedge compression fracture of fifth lumbar vertebra, initial encounter for closed fracture: Secondary | ICD-10-CM

## 2019-08-02 DIAGNOSIS — C7951 Secondary malignant neoplasm of bone: Secondary | ICD-10-CM | POA: Diagnosis present

## 2019-08-02 DIAGNOSIS — E7800 Pure hypercholesterolemia, unspecified: Secondary | ICD-10-CM | POA: Diagnosis present

## 2019-08-02 DIAGNOSIS — N401 Enlarged prostate with lower urinary tract symptoms: Secondary | ICD-10-CM | POA: Diagnosis not present

## 2019-08-02 DIAGNOSIS — I2510 Atherosclerotic heart disease of native coronary artery without angina pectoris: Secondary | ICD-10-CM | POA: Diagnosis present

## 2019-08-02 DIAGNOSIS — Z515 Encounter for palliative care: Secondary | ICD-10-CM

## 2019-08-02 DIAGNOSIS — R52 Pain, unspecified: Secondary | ICD-10-CM | POA: Diagnosis not present

## 2019-08-02 DIAGNOSIS — S22000A Wedge compression fracture of unspecified thoracic vertebra, initial encounter for closed fracture: Secondary | ICD-10-CM | POA: Diagnosis not present

## 2019-08-02 DIAGNOSIS — C7989 Secondary malignant neoplasm of other specified sites: Secondary | ICD-10-CM | POA: Diagnosis present

## 2019-08-02 DIAGNOSIS — Z7189 Other specified counseling: Secondary | ICD-10-CM

## 2019-08-02 DIAGNOSIS — C3490 Malignant neoplasm of unspecified part of unspecified bronchus or lung: Secondary | ICD-10-CM | POA: Diagnosis present

## 2019-08-02 DIAGNOSIS — Z8249 Family history of ischemic heart disease and other diseases of the circulatory system: Secondary | ICD-10-CM | POA: Diagnosis not present

## 2019-08-02 DIAGNOSIS — G893 Neoplasm related pain (acute) (chronic): Secondary | ICD-10-CM | POA: Diagnosis present

## 2019-08-02 DIAGNOSIS — M109 Gout, unspecified: Secondary | ICD-10-CM | POA: Diagnosis present

## 2019-08-02 DIAGNOSIS — Z20822 Contact with and (suspected) exposure to covid-19: Secondary | ICD-10-CM | POA: Diagnosis present

## 2019-08-02 DIAGNOSIS — C787 Secondary malignant neoplasm of liver and intrahepatic bile duct: Secondary | ICD-10-CM | POA: Diagnosis present

## 2019-08-02 DIAGNOSIS — Z79899 Other long term (current) drug therapy: Secondary | ICD-10-CM | POA: Diagnosis not present

## 2019-08-02 DIAGNOSIS — F17210 Nicotine dependence, cigarettes, uncomplicated: Secondary | ICD-10-CM | POA: Diagnosis present

## 2019-08-02 DIAGNOSIS — Z955 Presence of coronary angioplasty implant and graft: Secondary | ICD-10-CM | POA: Diagnosis not present

## 2019-08-02 LAB — CBC WITH DIFFERENTIAL/PLATELET
Abs Immature Granulocytes: 0.1 10*3/uL — ABNORMAL HIGH (ref 0.00–0.07)
Basophils Absolute: 0 10*3/uL (ref 0.0–0.1)
Basophils Relative: 0 %
Eosinophils Absolute: 0.2 10*3/uL (ref 0.0–0.5)
Eosinophils Relative: 2 %
HCT: 48.7 % (ref 39.0–52.0)
Hemoglobin: 16.1 g/dL (ref 13.0–17.0)
Immature Granulocytes: 1 %
Lymphocytes Relative: 4 %
Lymphs Abs: 0.4 10*3/uL — ABNORMAL LOW (ref 0.7–4.0)
MCH: 30.7 pg (ref 26.0–34.0)
MCHC: 33.1 g/dL (ref 30.0–36.0)
MCV: 92.8 fL (ref 80.0–100.0)
Monocytes Absolute: 0.9 10*3/uL (ref 0.1–1.0)
Monocytes Relative: 9 %
Neutro Abs: 8.2 10*3/uL — ABNORMAL HIGH (ref 1.7–7.7)
Neutrophils Relative %: 84 %
Platelets: 110 10*3/uL — ABNORMAL LOW (ref 150–400)
RBC: 5.25 MIL/uL (ref 4.22–5.81)
RDW: 14.5 % (ref 11.5–15.5)
WBC: 9.8 10*3/uL (ref 4.0–10.5)
nRBC: 0 % (ref 0.0–0.2)

## 2019-08-02 LAB — COMPREHENSIVE METABOLIC PANEL
ALT: 55 U/L — ABNORMAL HIGH (ref 0–44)
AST: 24 U/L (ref 15–41)
Albumin: 3.1 g/dL — ABNORMAL LOW (ref 3.5–5.0)
Alkaline Phosphatase: 77 U/L (ref 38–126)
Anion gap: 9 (ref 5–15)
BUN: 23 mg/dL (ref 8–23)
CO2: 28 mmol/L (ref 22–32)
Calcium: 9.8 mg/dL (ref 8.9–10.3)
Chloride: 103 mmol/L (ref 98–111)
Creatinine, Ser: 0.63 mg/dL (ref 0.61–1.24)
GFR calc Af Amer: >60 mL/min (ref >60)
GFR calc non Af Amer: >60 mL/min (ref >60)
Glucose, Bld: 118 mg/dL — ABNORMAL HIGH (ref 70–99)
Potassium: 4 mmol/L (ref 3.5–5.1)
Sodium: 140 mmol/L (ref 135–145)
Total Bilirubin: 1.4 mg/dL — ABNORMAL HIGH (ref 0.3–1.2)
Total Protein: 6.2 g/dL — ABNORMAL LOW (ref 6.5–8.1)

## 2019-08-02 LAB — SARS CORONAVIRUS 2 (TAT 6-24 HRS): SARS Coronavirus 2: NEGATIVE

## 2019-08-02 LAB — HIV ANTIBODY (ROUTINE TESTING W REFLEX): HIV Screen 4th Generation wRfx: NONREACTIVE

## 2019-08-02 MED ORDER — ONDANSETRON HCL 4 MG/2ML IJ SOLN: 4.0000 mg | Freq: Four times a day (QID) | INTRAMUSCULAR | Status: DC | PRN

## 2019-08-02 MED ORDER — SODIUM CHLORIDE 0.9 % IV SOLN
0.5000 mg/h | INTRAVENOUS | Status: DC
  Administered 2019-08-02: 17:00:00 0.5 mg/h via INTRAVENOUS
  Filled 2019-08-02: qty 5

## 2019-08-02 MED ORDER — HYDROMORPHONE HCL 1 MG/ML IJ SOLN
1.0000 mg | Freq: Once | INTRAMUSCULAR | Status: AC
  Administered 2019-08-02: 03:00:00 1 mg via INTRAVENOUS
  Filled 2019-08-02: qty 1

## 2019-08-02 MED ORDER — NALOXONE HCL 0.4 MG/ML IJ SOLN: 0.4000 mg | INTRAMUSCULAR | Status: DC | PRN

## 2019-08-02 MED ORDER — DIPHENHYDRAMINE HCL 50 MG/ML IJ SOLN: 12.5000 mg | Freq: Four times a day (QID) | INTRAMUSCULAR | Status: DC | PRN

## 2019-08-02 MED ORDER — GABAPENTIN 300 MG PO CAPS
600.0000 mg | ORAL_CAPSULE | Freq: Two times a day (BID) | ORAL | Status: DC
  Administered 2019-08-02 – 2019-08-04 (×6): 600 mg via ORAL
  Filled 2019-08-02 (×6): qty 2

## 2019-08-02 MED ORDER — ACETAMINOPHEN 650 MG RE SUPP: 650.0000 mg | Freq: Four times a day (QID) | RECTAL | Status: DC | PRN

## 2019-08-02 MED ORDER — METOPROLOL TARTRATE 50 MG PO TABS
50.0000 mg | ORAL_TABLET | Freq: Two times a day (BID) | ORAL | Status: DC
  Administered 2019-08-02 – 2019-08-04 (×4): 50 mg via ORAL
  Filled 2019-08-02 (×4): qty 1

## 2019-08-02 MED ORDER — HYDROMORPHONE HCL 1 MG/ML IJ SOLN
0.5000 mg | INTRAMUSCULAR | Status: DC | PRN
  Administered 2019-08-02 – 2019-08-04 (×7): 0.5 mg via INTRAVENOUS
  Filled 2019-08-02 (×7): qty 0.5

## 2019-08-02 MED ORDER — HYDROMORPHONE HCL 1 MG/ML IJ SOLN: 0.5000 mg | INTRAMUSCULAR | Status: DC | PRN

## 2019-08-02 MED ORDER — HYDROMORPHONE HCL 1 MG/ML IJ SOLN
0.5000 mg | Freq: Once | INTRAMUSCULAR | Status: AC
  Administered 2019-08-02: 02:00:00 0.5 mg via INTRAVENOUS
  Filled 2019-08-02: qty 1

## 2019-08-02 MED ORDER — POLYETHYLENE GLYCOL 3350 17 G PO PACK
17.0000 g | PACK | Freq: Every day | ORAL | Status: DC
  Filled 2019-08-02 (×2): qty 1

## 2019-08-02 MED ORDER — ONDANSETRON HCL 4 MG PO TABS: 4.0000 mg | ORAL_TABLET | Freq: Four times a day (QID) | ORAL | Status: DC | PRN

## 2019-08-02 MED ORDER — PANTOPRAZOLE SODIUM 40 MG PO TBEC
40.0000 mg | DELAYED_RELEASE_TABLET | Freq: Every day | ORAL | Status: DC
  Administered 2019-08-03 – 2019-08-04 (×2): 40 mg via ORAL
  Filled 2019-08-02 (×2): qty 1

## 2019-08-02 MED ORDER — MORPHINE SULFATE ER 30 MG PO TBCR
60.0000 mg | EXTENDED_RELEASE_TABLET | Freq: Three times a day (TID) | ORAL | Status: DC
  Administered 2019-08-02 – 2019-08-04 (×8): 60 mg via ORAL
  Filled 2019-08-02 (×8): qty 2

## 2019-08-02 MED ORDER — ACETAMINOPHEN 325 MG PO TABS
650.0000 mg | ORAL_TABLET | Freq: Four times a day (QID) | ORAL | Status: DC | PRN
  Filled 2019-08-02: qty 2

## 2019-08-02 MED ORDER — DEXAMETHASONE SODIUM PHOSPHATE 4 MG/ML IJ SOLN: 4.0000 mg | Freq: Four times a day (QID) | INTRAMUSCULAR | Status: DC

## 2019-08-02 MED ORDER — SENNOSIDES-DOCUSATE SODIUM 8.6-50 MG PO TABS: 1.0000 | ORAL_TABLET | Freq: Every evening | ORAL | Status: DC | PRN

## 2019-08-02 MED ORDER — SODIUM CHLORIDE 0.9% FLUSH: 9.0000 mL | INTRAVENOUS | Status: DC | PRN

## 2019-08-02 MED ORDER — HYDROMORPHONE 1 MG/ML IV SOLN
INTRAVENOUS | Status: DC
  Administered 2019-08-02: 1.5 mg via INTRAVENOUS
  Administered 2019-08-02: 30 mg via INTRAVENOUS
  Administered 2019-08-02: 3.5 mg via INTRAVENOUS
  Filled 2019-08-02: qty 30

## 2019-08-02 MED ORDER — TAMSULOSIN HCL 0.4 MG PO CAPS
0.4000 mg | ORAL_CAPSULE | Freq: Every day | ORAL | Status: DC
  Administered 2019-08-03 – 2019-08-04 (×2): 0.4 mg via ORAL
  Filled 2019-08-02 (×2): qty 1

## 2019-08-02 MED ORDER — DIPHENHYDRAMINE HCL 12.5 MG/5ML PO ELIX: 12.5000 mg | ORAL_SOLUTION | Freq: Four times a day (QID) | ORAL | Status: DC | PRN

## 2019-08-02 MED ORDER — DEXAMETHASONE SODIUM PHOSPHATE 4 MG/ML IJ SOLN
4.0000 mg | Freq: Four times a day (QID) | INTRAMUSCULAR | Status: DC
  Administered 2019-08-02 – 2019-08-04 (×10): 4 mg via INTRAVENOUS
  Filled 2019-08-02 (×10): qty 1

## 2019-08-02 MED ORDER — GABAPENTIN 400 MG PO CAPS
1200.0000 mg | ORAL_CAPSULE | Freq: Every day | ORAL | Status: DC
  Administered 2019-08-02 – 2019-08-03 (×2): 1200 mg via ORAL
  Filled 2019-08-02 (×2): qty 3

## 2019-08-02 NOTE — Consult Note (Signed)
Consultation Note Date: 08/02/2019   Patient Name: [name]  DOB: 10/04/1955  MRN: [id number]  Age / Sex: 64 y.o., male  PCP: Antony Blackbird, MD Referring Physician: Kayleen Memos, DO  Reason for Consultation: Establishing goals of care  HPI/Patient Profile: 64 y.o. male   admitted on 08/01/2019  Palliative consult for goals of care discussions as well as symptom management has been requested.  Chart has been reviewed, discussed with TRH MD, discussed with ex-wife Ms. Mardene Celeste on the phone.  Patient currently not able to verbalize or participate in history taking.   Clinical Assessment and Goals of Care: 64 year old gentleman with hypertension dyslipidemia coronary artery disease status post stent placement.  Within the last 4-6 weeks, patient was diagnosed with possible metastatic lung cancer with mets to liver, kidney, thyroid, bone, spine and brain.  Records have been reviewed.  Patient finished palliative radiation on January 15.  Patient was treated at [facility].  Patient has bilateral lower extremity weakness/numbness as well as fecal incontinence.  Certainly this is concerning for cord compression.  Patient however declined to undergo MRI earlier in this hospitalization.  He requested for adequate pain management options to be explored.  Patient's outpatient medication regimen was that he was on scheduled MS Contin as well as oxycodone.  Patient is currently resting in bed.  He does not appear to have nonverbal gestures of distress or discomfort.  No family present at the bedside however I have placed a call and discussed with patient's ex-wife Ms. Mardene Celeste.  Palliative medicine is specialized medical care for people living with serious illness. It focuses on providing relief from the symptoms and stress of a serious illness. The goal is to improve quality of life for both the patient  and the family.  Goals of care: Broad aims of medical therapy in relation to the patient's values and preferences. Our aim is to provide medical care aimed at enabling patients to achieve the goals that matter most to them, given the circumstances of their particular medical situation and their constraints.   Brief life review performed.  Patient reportedly was living in [place], [place] in a camper.  Patient has a dog.  Patient has been divorced from his wife.  Ms. Mardene Celeste endorses that her goals for the patient are to not undergo any suffering.  She completely endorses full scope of comfort measures.  We discussed about residential hospice options.  Please note additional discussions as listed in the summary of recommendations.  Thank you for the consult.  NEXT OF KIN Patient has an ex-wife Mardene Celeste, patient has a daughter.  SUMMARY OF RECOMMENDATIONS   Agree with DO NOT RESUSCITATE We will discontinue PCA as patient is not awake alert enough to use it at this time.  Patient may have bouts of uncontrolled pain if he is not awake/alert enough to use the PCA.  Hence, we will rotate to Dilaudid continuous infusion and also have Dilaudid IV as needed available to be used for additional comfort measures. Will  likely need to explore options for addition of hospice, transfer to residential hospice, if appropriate on 08-03-2019.  This was discussed with patient's ex-wife Ms. Mardene Celeste on the phone and she is in agreement for transition to hospice and for ongoing focus to be on comfort measures.  Code Status/Advance Care Planning:  DNR    Symptom Management:   As above  Palliative Prophylaxis:   Bowel Regimen     Psycho-social/Spiritual:   Desire for further Chaplaincy support:yes  Additional Recommendations: Education on Hospice  Prognosis:   < 2 weeks  Discharge Planning: To Be Determined      Primary Diagnoses: Present on Admission: . Cancer associated pain .  Metastatic lung cancer (metastasis from lung to other site) [facility]) . Metastatic cancer to spine ([place]) . Metastatic cancer to brain ([place]) . Benign prostatic hyperplasia with urinary frequency . Essential hypertension   I have reviewed the medical record, interviewed the patient and family, and examined the patient. The following aspects are pertinent.  Past Medical History:  Diagnosis Date  . Coronary artery disease   . Gout   . High cholesterol   . Hypertension    Social History   Socioeconomic History  . Marital status: Divorced    Spouse name: Not on file  . Number of children: Not on file  . Years of education: Not on file  . Highest education level: Not on file  Occupational History  . Not on file  Tobacco Use  . Smoking status: Current Every Day Smoker    Packs/day: 0.50  . Smokeless tobacco: Current User  Substance and Sexual Activity  . Alcohol use: Yes  . Drug use: No  . Sexual activity: Not on file  Other Topics Concern  . Not on file  Social History Narrative  . Not on file   Social Determinants of Health   Financial Resource Strain:   . Difficulty of Paying Living Expenses: Not on file  Food Insecurity:   . Worried About Charity fundraiser in the Last Year: Not on file  . Ran Out of Food in the Last Year: Not on file  Transportation Needs:   . Lack of Transportation (Medical): Not on file  . Lack of Transportation (Non-Medical): Not on file  Physical Activity:   . Days of Exercise per Week: Not on file  . Minutes of Exercise per Session: Not on file  Stress:   . Feeling of Stress : Not on file  Social Connections:   . Frequency of Communication with Friends and Family: Not on file  . Frequency of Social Gatherings with Friends and Family: Not on file  . Attends Religious Services: Not on file  . Active Member of Clubs or Organizations: Not on file  . Attends Archivist Meetings: Not on file  . Marital Status: Not on file   Family  History  Problem Relation Age of Onset  . Hypertension Father    Scheduled Meds: . dexamethasone (DECADRON) injection  4 mg Intravenous Q6H  . gabapentin  1,200 mg Oral QHS  . gabapentin  600 mg Oral BID  . metoprolol tartrate  50 mg Oral BID  . morphine  60 mg Oral Q8H  . pantoprazole  40 mg Oral Daily  . polyethylene glycol  17 g Oral Daily  . tamsulosin  0.4 mg Oral Daily   Continuous Infusions: . HYDROmorphone     PRN Meds:.acetaminophen **OR** acetaminophen, HYDROmorphone **AND** HYDROmorphone (DILAUDID) injection, ondansetron **OR** ondansetron (ZOFRAN) IV,  senna-docusate Medications Prior to Admission:  Prior to Admission medications   Medication Sig Start Date End Date Taking? Authorizing Provider  aspirin 325 MG tablet Take 325 mg by mouth daily.   Yes [provider]  buPROPion (WELLBUTRIN SR) 150 MG 12 hr tablet Take 1 tablet (150 mg total) by mouth 2 (two) times daily. Patient taking differently: Take 150 mg by mouth daily.  04/29/19  Yes Argentina Donovan, PA-C  clopidogrel (PLAVIX) 75 MG tablet Take 1 tablet (75 mg total) by mouth daily with breakfast. 04/29/19  Yes McClung, Angela M, PA-C  dexamethasone (DECADRON) 1 MG tablet Take 1-2 mg by mouth See admin instructions. Take 2 tablets by mouth twice a day for 2 days then 2 tablets daily for 3 days then 1 tablet daily for 3 days 07/25/19  Yes [provider]  fenofibrate 160 MG tablet Take 160 mg by mouth daily.   Yes [provider]  gabapentin (NEURONTIN) 600 MG tablet Take 600-1,200 mg by mouth See admin instructions. 600 mg twice a day and 1200 mg at bedtime   Yes [provider]  hydrochlorothiazide (HYDRODIURIL) 25 MG tablet Take 1 tablet (25 mg total) by mouth daily. 04/29/19  Yes Freeman Caldron M, PA-C  lisinopril (ZESTRIL) 40 MG tablet Take 1 tablet (40 mg total) by mouth daily. 04/29/19  Yes Freeman Caldron M, PA-C  metoprolol tartrate (LOPRESSOR) 50 MG tablet Take 1 tablet (50  mg total) by mouth 2 (two) times daily. Patient taking differently: Take 50 mg by mouth daily.  04/29/19  Yes McClung, Dionne Bucy, PA-C  morphine (MS CONTIN) 60 MG 12 hr tablet Take 60 mg by mouth every 8 (eight) hours. 07/29/19  Yes [provider]  oxyCODONE-acetaminophen (PERCOCET) 10-325 MG tablet Take 1 tablet by mouth 3 (three) times daily as needed for pain.  06/10/19  Yes [provider]  pantoprazole (PROTONIX) 40 MG tablet Take 40 mg by mouth daily.   Yes [provider]  rosuvastatin (CRESTOR) 20 MG tablet Take 1 tablet (20 mg total) by mouth daily. 04/29/19  Yes Argentina Donovan, PA-C  tamsulosin (FLOMAX) 0.4 MG CAPS capsule Take 1 capsule (0.4 mg total) by mouth daily. 04/29/19  Yes Freeman Caldron M, PA-C   No Known Allergies Review of Systems No pain  Physical Exam Patient is resting in bed, currently with eyes closed Patient is not awake not alert Patient starts and mumbles some when his name is called Has some abdominal distention Has trace edema Regular pattern of breathing S1-S2  Vital Signs: BP 126/84 (BP Location: Left Arm)   Pulse 90   Temp 97.7 F (36.5 C) (Oral)   Resp 18   Ht 6\' 4"  (1.93 m)   Wt 100.8 kg   SpO2 91%   BMI 27.05 kg/m  Pain Scale: 0-10 POSS *See Group Information*: S-Acceptable,Sleep, easy to arouse Pain Score: Asleep   SpO2: SpO2: 91 % O2 Device:SpO2: 91 % O2 Flow Rate: .   IO: Intake/output summary:   Intake/Output Summary (Last 24 hours) at 08/02/2019 1506 Last data filed at 08/02/2019 1506 Gross per 24 hour  Intake 270 ml  Output --  Net 270 ml    LBM: Last BM Date: 08/01/19 Baseline Weight: Weight: 100.8 kg Most recent weight: Weight: 100.8 kg     Palliative Assessment/Data:   PPS 30%  Time In:  1400 Time Out:  1500 Time Total:  60  Greater than 50%  of this time was spent counseling  and coordinating care related to the above assessment and plan.  Signed by: [name], MD   Please  contact Palliative Medicine Team phone at [phone] for questions and concerns.  For individual provider: See Shea Evans

## 2019-08-02 NOTE — H&P (Signed)
History and Physical    [name] OJJ:[id number] DOB: July 09, 1956 DOA: 08/01/2019  PCP: Antony Blackbird, MD  Patient coming from: Home  I have personally briefly reviewed patient's old medical records in [place]  Chief Complaint: Back pain  HPI: [name] is a 64 y.o. male with medical history significant of HTN, HLD, CAD s/p 2 stents.  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  Finished pal radiation on 1/15 per ex-wife who is POA.  Patient has been living with daughter, has been unable to ambulate since discharge from [facility] on 1/4 earlier this month.  At that time looks like he declined further PT/OT.  Today had fecal incontinence (actually isnt clear on wether it was true incontinence or wether he just wasn't able to get to bathroom in time) as well as increased back pain which prompted him to come in to the ED.  He does have BLE weakness / numbness, but isnt sure wether this is worse today than it has been.  He is on oxycodone and MS contin at home.   ED Course: Initially was confused for EDP, but is clear, alert and oriented for me on my evaluation.  He recognizes that he is dying of incurable metastatic lung cancer.  His goals of care at this point are pain control.  He recognizes that he really isnt a good surgical candidate at this point even if lung CA in his back could be paralyzing him.  He is interested in discussing what hospice has to offer.  200cc urine in bladder, they tried to cath him in ED but were unable to due to pain.   Review of Systems: As per HPI, otherwise all review of systems negative.  Past Medical History:  Diagnosis Date  . Coronary artery disease   . Gout   . High cholesterol   . Hypertension     Past Surgical History:  Procedure Laterality Date  . CAROTID STENT       reports that he has been smoking. He has been smoking about 0.50 packs per day. He uses smokeless tobacco. He  reports current alcohol use. He reports that he does not use drugs.  No Known Allergies  Family History  Problem Relation Age of Onset  . Hypertension Father      Prior to Admission medications   Medication Sig Start Date End Date Taking? Authorizing Provider  aspirin 325 MG tablet Take 325 mg by mouth daily.   Yes [provider]  buPROPion (WELLBUTRIN SR) 150 MG 12 hr tablet Take 1 tablet (150 mg total) by mouth 2 (two) times daily. Patient taking differently: Take 150 mg by mouth daily.  04/29/19  Yes Argentina Donovan, PA-C  clopidogrel (PLAVIX) 75 MG tablet Take 1 tablet (75 mg total) by mouth daily with breakfast. 04/29/19  Yes McClung, Angela M, PA-C  dexamethasone (DECADRON) 1 MG tablet Take 1-2 mg by mouth See admin instructions. Take 2 tablets by mouth twice a day for 2 days then 2 tablets daily for 3 days then 1 tablet daily for 3 days 07/25/19  Yes [provider]  fenofibrate 160 MG tablet Take 160 mg by mouth daily.   Yes [provider]  gabapentin (NEURONTIN) 600 MG tablet Take 600-1,200 mg by mouth See admin instructions. 600 mg twice a day and 1200 mg at bedtime   Yes [provider]  hydrochlorothiazide (HYDRODIURIL) 25 MG tablet Take 1 tablet (25  mg total) by mouth daily. 04/29/19  Yes Freeman Caldron M, PA-C  lisinopril (ZESTRIL) 40 MG tablet Take 1 tablet (40 mg total) by mouth daily. 04/29/19  Yes Freeman Caldron M, PA-C  metoprolol tartrate (LOPRESSOR) 50 MG tablet Take 1 tablet (50 mg total) by mouth 2 (two) times daily. Patient taking differently: Take 50 mg by mouth daily.  04/29/19  Yes McClung, Dionne Bucy, PA-C  morphine (MS CONTIN) 60 MG 12 hr tablet Take 60 mg by mouth every 8 (eight) hours. 07/29/19  Yes [provider]  oxyCODONE-acetaminophen (PERCOCET) 10-325 MG tablet Take 1 tablet by mouth 3 (three) times daily as needed for pain.  06/10/19  Yes [provider]  pantoprazole (PROTONIX) 40 MG tablet Take 40  mg by mouth daily.   Yes [provider]  rosuvastatin (CRESTOR) 20 MG tablet Take 1 tablet (20 mg total) by mouth daily. 04/29/19  Yes Argentina Donovan, PA-C  tamsulosin (FLOMAX) 0.4 MG CAPS capsule Take 1 capsule (0.4 mg total) by mouth daily. 04/29/19  Yes Argentina Donovan, [place]    Physical Exam: Vitals:   08/02/19 0230 08/02/19 0300 08/02/19 0330 08/02/19 0400  BP: (!) 127/93 131/86 116/66 132/67  Pulse: 97 96 (!) 107 (!) 108  Resp: 14 15 17  (!) 23  Temp:      TempSrc:      SpO2: 92% 91% 96% 93%    Constitutional: NAD, calm, comfortable Eyes: PERRL, lids and conjunctivae normal ENMT: Mucous membranes are moist. Posterior pharynx clear of any exudate or lesions.Normal dentition.  Neck: normal, supple, no masses, no thyromegaly Respiratory: clear to auscultation bilaterally, no wheezing, no crackles. Normal respiratory effort. No accessory muscle use.  Cardiovascular: Regular rate and rhythm, no murmurs / rubs / gallops. No extremity edema. 2+ pedal pulses. No carotid bruits.  Abdomen: no tenderness, no masses palpated. No hepatosplenomegaly. Bowel sounds positive.  Musculoskeletal: no clubbing / cyanosis. No joint deformity upper and lower extremities. Good ROM, no contractures. Normal muscle tone.  Skin: no rashes, lesions, ulcers. No induration Neurologic: Difficult time lifting either leg off of bed secondary to back pain, normal strength with plantar and dorsiflexion. Psychiatric: Normal judgment and insight. Alert and oriented x 3. Normal mood.    Labs on Admission: I have personally reviewed following labs and imaging studies  CBC: Recent Labs  Lab 08/01/19 2342  WBC 9.8  NEUTROABS 8.2*  HGB 16.1  HCT 48.7  MCV 92.8  PLT 144*   Basic Metabolic Panel: Recent Labs  Lab 08/01/19 2342  NA 140  K 4.0  CL 103  CO2 28  GLUCOSE 118*  BUN 23  CREATININE 0.63  CALCIUM 9.8   GFR: CrCl cannot be calculated (Unknown ideal weight.). Liver Function  Tests: Recent Labs  Lab 08/01/19 2342  AST 24  ALT 55*  ALKPHOS 77  BILITOT 1.4*  PROT 6.2*  ALBUMIN 3.1*   No results for input(s): LIPASE, AMYLASE in the last 168 hours. No results for input(s): AMMONIA in the last 168 hours. Coagulation Profile: No results for input(s): INR, PROTIME in the last 168 hours. Cardiac Enzymes: No results for input(s): CKTOTAL, CKMB, CKMBINDEX, TROPONINI in the last 168 hours. BNP (last 3 results) No results for input(s): PROBNP in the last 8760 hours. HbA1C: No results for input(s): HGBA1C in the last 72 hours. CBG: No results for input(s): GLUCAP in the last 168 hours. Lipid Profile: No results for input(s): CHOL, HDL, LDLCALC, TRIG, CHOLHDL, LDLDIRECT in the last 72  hours. Thyroid Function Tests: No results for input(s): TSH, T4TOTAL, FREET4, T3FREE, THYROIDAB in the last 72 hours. Anemia Panel: No results for input(s): VITAMINB12, FOLATE, FERRITIN, TIBC, IRON, RETICCTPCT in the last 72 hours. Urine analysis:    Component Value Date/Time   COLORURINE YELLOW 04/12/2016 [place] 04/12/2016 1721   LABSPEC 1.021 04/12/2016 1721   PHURINE 5.5 04/12/2016 1721   GLUCOSEU NEGATIVE 04/12/2016 1721   HGBUR TRACE (A) 04/12/2016 1721   BILIRUBINUR NEGATIVE 04/12/2016 1721   KETONESUR NEGATIVE 04/12/2016 1721   PROTEINUR NEGATIVE 04/12/2016 1721   UROBILINOGEN 1 12/31/2013 1538   NITRITE NEGATIVE 04/12/2016 1721   LEUKOCYTESUR 1+ (A) 04/12/2016 1721    Radiological Exams on Admission: DG Thoracic Spine 2 View  Result Date: 08/02/2019 CLINICAL DATA:  Back pain. History of metastatic cancer. EXAM: THORACIC SPINE 2 VIEWS COMPARISON:  Thoracic spine report 07/08/2019 at an outside institution, images not available. FINDINGS: Known T3 compression fracture with underlying lesion not well characterized radiographically. Known T6 lesion described on MRI is also not well seen. No evidence of acute compression fracture. Overall alignment is  maintained. IMPRESSION: Previously described T3 and T6 vertebral body lesions with mild T3 compression fracture on MRI are not demonstrated radiographically. No radiographic evidence of acute abnormality. Electronically Signed   By: Keith Rake M.D.   On: 08/02/2019 01:13   DG Lumbar Spine Complete  Result Date: 08/02/2019 CLINICAL DATA:  Back pain.  History of metastatic cancer. EXAM: LUMBAR SPINE - COMPLETE 4+ VIEW COMPARISON:  Report from lumbar spine MRI 07/04/2019 at an outside institution, images not available. FINDINGS: Abnormal decreased density involving L5 vertebral body with moderate compression fracture. Previously described abnormal signal within L3 and L4 vertebra on MRI have no definite radiographic correlate. The remainder of the lumbar vertebral body heights are preserved. Degenerative change of bilateral sacroiliac joints. IMPRESSION: Abnormal decreased density involving L5 vertebral body with moderate compression fracture. Prior lumbar spine MRI demonstrated pathologic fracture with underlying bone lesion. Images not available for direct comparison. Previously described abnormal signal within L3 and L4 vertebra have no definite radiographic correlate. Electronically Signed   By: Keith Rake M.D.   On: 08/02/2019 01:10   CT Head Wo Contrast  Result Date: 08/02/2019 CLINICAL DATA:  Encephalopathy EXAM: CT HEAD WITHOUT CONTRAST TECHNIQUE: Contiguous axial images were obtained from the base of the skull through the vertex without intravenous contrast. COMPARISON:  None. FINDINGS: Brain: There is no mass, hemorrhage or extra-axial collection. The size and configuration of the ventricles and extra-axial CSF spaces are normal. There is a small chronic lacunar infarct of the right basal ganglia. Brain parenchyma is otherwise normal. Vascular: No abnormal hyperdensity of the major intracranial arteries or dural venous sinuses. No intracranial atherosclerosis. Skull: The visualized skull  base, calvarium and extracranial soft tissues are normal. Sinuses/Orbits: Complete opacification of the left maxillary sinus. The orbits are normal. IMPRESSION: 1. No acute intracranial abnormality. 2. 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 3. Left maxillary sinus opacification. Electronically Signed   By: Ulyses Jarred M.D.   On: 08/02/2019 01:28    EKG: Independently reviewed.  Assessment/Plan Principal Problem:   Cancer associated pain Active Problems:   Essential hypertension   Benign prostatic hyperplasia with urinary frequency   Metastatic lung cancer (metastasis from lung to other site) [facility])   Metastatic cancer to spine [facility])   Metastatic cancer to brain ([place])    1. Cancer associated back pain - 1. Unclear if having  worsening neurologic injury / acute cord compression at this point or not. 2. Regardless, not a surgical candidate at this point. 1. Which patient himself agrees with. 3. Will attempt to control pain: 1. Cont home MS contin 2. Start dilaudid PCA (PRN dilaudid until he gets upstairs and this can be started). 3. Start decadron 4mg  IV Q6H 4. Cont home Neurontin 4. Will obtain MRI of T and L spine in AM for purpose of seeing if there is anything more that can be done minimally or non-invasively to control pain (ie kyphoplasty for compression fracture or further radiation therapy if tumor extension present) 2. Metastatic lung CA - 1. Patient interested in hospice care / pain control 2. Will put in for Pal care to see patient in AM 3. Urinary retention - 1. 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 Cont Flomax 2. Unclear if acute urinary retention present as well (either neurogenic or due to opiates). 3. ~200cc in bladder currently 4. Unable to cath in ED due to pain 5. Monitor intake and output 6. If unable to urinate throughout day, will need to attempt foley placement with pain control. 4. H/o CAD - 1. Cont statin 5. Holding ASA/Plavix until MRI done  to make sure he doesn't have epidural hematoma 6. HTN - 1. Cont metoprolol 2. Cont flomax 3. Will hold the lisinopril and HCTZ for the moment to give [place] some BP room to work with for narcotics.  DVT prophylaxis: SCDs for now - pending MRI to make sure there isnt an epidural hematoma Code Status: DNR/DNI - confirmed directly with patient Family Communication: Spoke with Exwife who is POA Disposition Plan: Home with hospice vs SNF with hospice vs hospice facility, TBD Consults called: Consult to pal care put in Admission status: Admit to inpatient  Severity of Illness: The appropriate patient status for this patient is INPATIENT. Inpatient status is judged to be reasonable and necessary in order to provide the required intensity of service to ensure the patient's safety. The patient's presenting symptoms, physical exam findings, and initial radiographic and laboratory data in the context of their chronic comorbidities is felt to place them at high risk for further clinical deterioration. Furthermore, it is not anticipated that the patient will be medically stable for discharge from the hospital within 2 midnights of admission. The following factors support the patient status of inpatient.   IP status for cancer pain control needing IV narcotics, failed PO narcotics as outpatient, end of life care discussions with pal care and hospice, inability to ambulate.  * I certify that at the point of admission it is my clinical judgment that the patient will require inpatient hospital care spanning beyond 2 midnights from the point of admission due to high intensity of service, high risk for further deterioration and high frequency of surveillance required.*    Sissi Padia M. DO [facility]  How to contact the [facility] Attending or Consulting provider [place] or covering provider during after hours [place], for this patient?  1. Check the care team in [facility] and look for a) attending/consulting TR[facility]  provider listed and b) the [facility] team listed 2. Log into [email or web address]  Amion Physician Scheduling and messaging for groups and whole hospitals  On call and physician scheduling software for group practices, residents, hospitalists and other medical providers for call, clinic, rotation and shift schedules. OnCall Enterprise is a hospital-wide system for scheduling doctors and paging doctors on call. EasyPlot is for scientific plotting and data analysis.  [email or web address]  and use [place]'s universal password to access. If you do not have the password, please contact the hospital operator.  3. Locate the [facility] provider you are looking for under [facility] and page to a number that you can be directly reached. 4. If you still have difficulty reaching the provider, please page the [facility] (Director on Call) for the Hospitalists listed on amion for assistance.  08/02/2019, 4:16 AM

## 2019-08-02 NOTE — ED Notes (Signed)
In and out failed. Patient unable to tolerate the pain after RN met resistance when inserting the tube in. Patient stated "pull it out please, I can't take it". Patient HR jump to 120-140's while doing the procedure.

## 2019-08-02 NOTE — ED Notes (Signed)
Pt unable to tolerate getting up or even on sitting position. Pt 10/10 pain at this time.

## 2019-08-02 NOTE — Progress Notes (Signed)
Admission history not complete due to patient not being a good historian due to forgetfulness and confusion.

## 2019-08-02 NOTE — Progress Notes (Signed)
[name] is a 64 y.o. male with medical history significant of HTN, HLD, CAD s/p 2 stents.  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  Finished pal radiation on 1/15 per ex-wife who is POA.  Patient has been living with daughter, has been unable to ambulate since discharge [facility] on 1/4 earlier this month.  At that time looks like he declined further PT/OT.  Today had fecal incontinence (actually isnt clear on wether it was true incontinence or wether he just wasn't able to get to bathroom in time) as well as increased back pain which prompted him to come in to the ED.  He does have BLE weakness / numbness, but isnt sure wether this is worse today than it has been.  He is on oxycodone and MS contin at home.  08/02/19: Seen and examined.  Patient declines MRI of the spine.  States main reason for his presentation was for pain control.  Palliative care will see in consultation.  Has a PCA Dilaudid pump in place.  Please refer to H&P dictated by my partner Dr. Alcario Drought on 08/02/2019 for further details of the assessment and plan.

## 2019-08-03 ENCOUNTER — Ambulatory Visit: Payer: Managed Care, Other (non HMO) | Admitting: Physician Assistant

## 2019-08-03 ENCOUNTER — Encounter: Payer: Self-pay | Admitting: *Deleted

## 2019-08-03 ENCOUNTER — Other Ambulatory Visit: Payer: Managed Care, Other (non HMO)

## 2019-08-03 ENCOUNTER — Encounter (HOSPITAL_COMMUNITY): Payer: Self-pay | Admitting: Internal Medicine

## 2019-08-03 DIAGNOSIS — Z66 Do not resuscitate: Secondary | ICD-10-CM

## 2019-08-03 DIAGNOSIS — G893 Neoplasm related pain (acute) (chronic): Principal | ICD-10-CM

## 2019-08-03 MED ORDER — ASPIRIN 325 MG PO TABS
325.0000 mg | ORAL_TABLET | Freq: Every day | ORAL | Status: DC
  Administered 2019-08-03 – 2019-08-04 (×2): 325 mg via ORAL
  Filled 2019-08-03 (×2): qty 1

## 2019-08-03 MED ORDER — CLOPIDOGREL BISULFATE 75 MG PO TABS
75.0000 mg | ORAL_TABLET | Freq: Every day | ORAL | Status: DC
  Administered 2019-08-03 – 2019-08-04 (×2): 75 mg via ORAL
  Filled 2019-08-03 (×2): qty 1

## 2019-08-03 MED ORDER — ROSUVASTATIN CALCIUM 20 MG PO TABS
20.0000 mg | ORAL_TABLET | Freq: Every day | ORAL | Status: DC
  Administered 2019-08-03 – 2019-08-04 (×2): 20 mg via ORAL
  Filled 2019-08-03 (×2): qty 1

## 2019-08-03 NOTE — TOC Initial Note (Signed)
Transition of Care [facility]) - Initial/Assessment Note    Patient Details  Name: [name] MRN: [id number] Date of Birth: 12/12/1955  [facility]) CM/SW Contact:    Lynnell Catalan, RN Phone Number: 08/03/2019, 12:25 PM  Clinical Narrative:                 This CM was contacted by PMT MD to inform this CM that pt has agreed to referral to [facility]. Pt from home alone (lives in a [place]). Has a daughter and ex-wife for support. Metallurgist contacted for referral.   Expected Discharge Plan: [place] Barriers to Discharge: No Barriers Identified   Expected Discharge Plan and Services Expected Discharge Plan: [place]   Discharge Planning Services: CM Consult   Living arrangements for the past 2 months: Mobile Home([place])                                      Prior Living Arrangements/Services Living arrangements for the past 2 months: Mobile Home([place]) Lives with:: Self                   Activities of Daily Living Home Assistive Devices/Equipment: Eyeglasses, Environmental consultant (specify type)(front wheeled walker) ADL Screening (condition at time of admission) Patient's cognitive ability adequate to safely complete daily activities?: No Is the patient deaf or have difficulty hearing?: No Does the patient have difficulty seeing, even when wearing glasses/contacts?: No Does the patient have difficulty concentrating, remembering, or making decisions?: Yes Patient able to express need for assistance with ADLs?: Yes Does the patient have difficulty dressing or bathing?: No Independently performs ADLs?: Yes (appropriate for developmental age) Does the patient have difficulty walking or climbing stairs?: Yes(secondary to back pain) Weakness of Legs: Both Weakness of Arms/Hands: None  Permission Sought/Granted                  Emotional Assessment Appearance:: Appears older than stated age             Admission diagnosis:  Cancer associated pain [G89.3] Primary malignant neoplasm of lung metastatic to other site, unspecified laterality ([place]) [C34.90] Compression fracture of body of thoracic vertebra ([place]) [S22.000A] Compression fracture of L5 vertebra, initial encounter ([place]) [S32.050A] Patient Active Problem List   Diagnosis Date Noted  . Cancer associated pain 08/02/2019  . Metastatic lung cancer (metastasis from lung to other site) ([place]) 08/02/2019  . Metastatic cancer to spine ([place]) 08/02/2019  . Metastatic cancer to brain ([place]) 08/02/2019  . DNR (do not resuscitate) 08/02/2019  . Encounter for hospice care discussion 08/02/2019  . Benign prostatic hyperplasia with urinary frequency 09/19/2016  . Colon cancer screening 09/19/2016  . Abnormal ECG 09/19/2016  . Other secondary gout of left hand 02/21/2015  . Arteriosclerotic coronary artery disease 12/31/2013  . Insomnia 12/31/2013  . Arteriosclerosis of coronary artery 12/16/2013  . Essential hypertension 12/16/2013  . Mixed hyperlipidemia 12/16/2013  . Tobacco use 12/16/2013   PCP:  Antony Blackbird, MD Pharmacy:   [facility], [place] [address] [place] [address] Phone: [phone] Fax: [phone]  [id number] [place], [address] [place] [address] Phone: [phone] Fax: [phone]  [id number] - [place], [place] [place] [address]  Phone: [phone] Fax: [phone]     Social Determinants of Health (SDOH) Interventions    Readmission Risk Interventions No flowsheet data found.

## 2019-08-03 NOTE — Progress Notes (Signed)
Oncology Nurse Navigator Documentation  Oncology Nurse Navigator Flowsheets 08/03/2019  Navigator Location [facility]  Navigator Encounter Type Other/I received notification from Dr. Julien Nordmann that patient is going to go with Hospice care and no need to schedule.  I updated new patient coordinator.   Telephone -  Patient Visit Type Inpatient  Barriers/Navigation Needs -  Education -  Interventions Other  Acuity Level 2-Minimal Needs (1-2 Barriers Identified)  Coordination of Care -  Time Spent with Patient 15

## 2019-08-03 NOTE — Progress Notes (Signed)
PROGRESS NOTE  [name] OMV:[id number] DOB: 11-06-1955 DOA: 08/01/2019 PCP: [name] Blackbird, MD  HPI/Recap of past 24 hours: [name] is a 64 y.o. male with medical history significant of  metastatic lung cancer with mets to liver, kidney, thyroid, bone, spine, and brain, HTN, HLD, CAD s/p 2 stents who presented with complaints of severe lower back pain.  Associated with bilateral lower extremity weakness and numbness.  On presentation patient declined further work-up, requesting for Pain Control.  Seen by Palliative Care Team.  Plan to Transition to Hospice Care.  Patient Has a Poor Prognosis Less Than 2 Weeks.  08/03/19: Seen and examined.  His main concern is with pain management.  Transition of care team assisting with placement with hospice care.   Assessment/Plan: Principal Problem:   Cancer associated pain Active Problems:   Essential hypertension   Benign prostatic hyperplasia with urinary frequency   Metastatic lung cancer (metastasis from lung to other site) [facility])   Metastatic cancer to spine [facility])   Metastatic cancer to brain [facility])   DNR (do not resuscitate)   Encounter for hospice care discussion   Cancer related back pain Patient has declined all work-up Transitioning to hospice care Continue pain management as recommended by palliative care team. Transition of care team assisting with placement with hospice care.  Metastatic lung cancer Mets to liver, kidney, thyroid, bone, spine, and brain Poor prognosis less than 2 weeks Continue supportive care Appreciate palliative care team's assistance  Urinary retention post Foley cath insertion Continue Foley cath Continue Flomax  Coronary artery disease Continue statin, aspirin and Plavix Patient wishes to continue his medications at this time until he is transitioned to hospice  Essential hypertension Blood pressure is stable  Goals of care Very poor prognosis < 2 weeks Follow-up with palliative care  team. Plan is to discharge with hospice care    DVT prophylaxis: SCDs for now - pending MRI to make sure there isnt an epidural hematoma Code Status: DNR/DNI - confirmed directly with patient Family Communication: Spoke with Exwife who is POA   Disposition Plan:  Patient is from home.  Plan to discharge to facility with hospice care.  Barrier to discharge hospice care placement.   Consults called:  Palliative care team.   Objective: Vitals:   08/02/19 1756 08/02/19 2107 08/03/19 0546 08/03/19 1321  BP: 112/73 133/80 (!) 118/56 115/71  Pulse: 72 84 65 66  Resp: 16 16 17 16   Temp: 98 F (36.7 C) 98 F (36.7 C) 97.6 F (36.4 C) 98 F (36.7 C)  TempSrc: Oral Oral Oral Oral  SpO2: 96% 95% 92% 90%  Weight:      Height:        Intake/Output Summary (Last 24 hours) at 08/03/2019 1330 Last data filed at 08/03/2019 6283 Gross per 24 hour  Intake 280.61 ml  Output 400 ml  Net -119.39 ml   Filed Weights   08/02/19 0514  Weight: 100.8 kg    Exam:  . General: 64 y.o. year-old male pleasant well developed well nourished in no acute distress.  Alert and oriented x3. . Cardiovascular: Regular rate and rhythm with no rubs or gallops.  No thyromegaly or JVD noted.   Marland Kitchen Respiratory: Clear to auscultation with no wheezes or rales. Good inspiratory effort. . Abdomen: Soft nontender nondistended with normal bowel sounds x4 quadrants. . Musculoskeletal: No lower extremity edema. 2/4 pulses in all 4 extremities. Marland Kitchen Psychiatry: Mood is appropriate for condition and setting   Data Reviewed:  CBC: Recent Labs  Lab 08/01/19 2342  WBC 9.8  NEUTROABS 8.2*  HGB 16.1  HCT 48.7  MCV 92.8  PLT 676*   Basic Metabolic Panel: Recent Labs  Lab 08/01/19 2342  NA 140  K 4.0  CL 103  CO2 28  GLUCOSE 118*  BUN 23  CREATININE 0.63  CALCIUM 9.8   GFR: Estimated Creatinine Clearance: 116 mL/min (by C-G formula based on SCr of 0.63 mg/dL). Liver Function Tests: Recent Labs  Lab  08/01/19 2342  AST 24  ALT 55*  ALKPHOS 77  BILITOT 1.4*  PROT 6.2*  ALBUMIN 3.1*   No results for input(s): LIPASE, AMYLASE in the last 168 hours. No results for input(s): AMMONIA in the last 168 hours. Coagulation Profile: No results for input(s): INR, PROTIME in the last 168 hours. Cardiac Enzymes: No results for input(s): CKTOTAL, CKMB, CKMBINDEX, TROPONINI in the last 168 hours. BNP (last 3 results) No results for input(s): PROBNP in the last 8760 hours. HbA1C: No results for input(s): HGBA1C in the last 72 hours. CBG: No results for input(s): GLUCAP in the last 168 hours. Lipid Profile: No results for input(s): CHOL, HDL, LDLCALC, TRIG, CHOLHDL, LDLDIRECT in the last 72 hours. Thyroid Function Tests: No results for input(s): TSH, T4TOTAL, FREET4, T3FREE, THYROIDAB in the last 72 hours. Anemia Panel: No results for input(s): VITAMINB12, FOLATE, FERRITIN, TIBC, IRON, RETICCTPCT in the last 72 hours. Urine analysis:    Component Value Date/Time   COLORURINE YELLOW 04/12/2016 [place] 04/12/2016 1721   LABSPEC 1.021 04/12/2016 1721   PHURINE 5.5 04/12/2016 1721   GLUCOSEU NEGATIVE 04/12/2016 1721   HGBUR TRACE (A) 04/12/2016 1721   BILIRUBINUR NEGATIVE 04/12/2016 1721   KETONESUR NEGATIVE 04/12/2016 1721   PROTEINUR NEGATIVE 04/12/2016 1721   UROBILINOGEN 1 12/31/2013 1538   NITRITE NEGATIVE 04/12/2016 1721   LEUKOCYTESUR 1+ (A) 04/12/2016 1721   Sepsis Labs: @LABRCNTIP (procalcitonin:4,lacticidven:4)  ) Recent Results (from the past 240 hour(s))  SARS CORONAVIRUS 2 (TAT 6-24 HRS) Nasopharyngeal Nasopharyngeal Swab     Status: None   Collection Time: 08/02/19  3:24 AM   Specimen: Nasopharyngeal Swab  Result Value Ref Range Status   SARS Coronavirus 2 NEGATIVE NEGATIVE Final    Comment: (NOTE) SARS-CoV-2 target nucleic acids are NOT DETECTED. The SARS-CoV-2 RNA is generally detectable in upper and lower respiratory specimens during the acute  phase of infection. Negative results do not preclude SARS-CoV-2 infection, do not rule out co-infections with other pathogens, and should not be used as the sole basis for treatment or other patient management decisions. Negative results must be combined with clinical observations, patient history, and epidemiological information. The expected result is Negative. Fact Sheet for Patients: SugarRoll.be Fact Sheet for Healthcare Providers: https://www.woods-mathews.com/ This test is not yet approved or cleared by the [place] FDA and  has been authorized for detection and/or diagnosis of SARS-CoV-2 by FDA under an Emergency Use Authorization (EUA). This EUA will remain  in effect (meaning this test can be used) for the duration of the COVID-19 declaration under Section 56 4(b)(1) of the Act, 21 U.S.C. section 360bbb-3(b)(1), unless the authorization is terminated or revoked sooner. Performed at [facility], [place] [address], [place], [place] [address]       Studies: No results found.  Scheduled Meds: . dexamethasone (DECADRON) injection  4 mg Intravenous Q6H  . gabapentin  1,200 mg Oral QHS  . gabapentin  600 mg Oral BID  . metoprolol tartrate  50 mg Oral BID  . morphine  60 mg Oral Q8H  . pantoprazole  40 mg Oral Daily  . polyethylene glycol  17 g Oral Daily  . tamsulosin  0.4 mg Oral Daily    Continuous Infusions: . HYDROmorphone 0.5 mg/hr (08/03/19 0400)     LOS: 1 day     Kayleen Memos, MD [facility] Pager [phone]  If 7PM-7AM, please contact night-coverage [email or web address] Password [email or web address] 08/03/2019, 1:30 PM

## 2019-08-03 NOTE — Progress Notes (Signed)
Daily Progress Note   Patient Name: [name]       Date: 08/03/2019 DOB: [birth year]  Age: 64 y.o. MRN#: [id number] Attending Physician: Kayleen Memos, DO Primary Care Physician: Antony Blackbird, MD Admit Date: 08/01/2019  Reason for Consultation/Follow-up: Establishing goals of care  Subjective:  [name] is awake alert, resting in bed. He ate reasonably for breakfast.  He still has bilateral LE weakness, worse in LLE. Also has pain in his back.   Length of Stay: 1  Current Medications: Scheduled Meds:  . dexamethasone (DECADRON) injection  4 mg Intravenous Q6H  . gabapentin  1,200 mg Oral QHS  . gabapentin  600 mg Oral BID  . metoprolol tartrate  50 mg Oral BID  . morphine  60 mg Oral Q8H  . pantoprazole  40 mg Oral Daily  . polyethylene glycol  17 g Oral Daily  . tamsulosin  0.4 mg Oral Daily    Continuous Infusions: . HYDROmorphone 0.5 mg/hr (08/03/19 0400)    PRN Meds: acetaminophen **OR** acetaminophen, HYDROmorphone **AND** HYDROmorphone (DILAUDID) injection, ondansetron **OR** ondansetron (ZOFRAN) IV, senna-docusate  Physical Exam         Awake alert Has significant weakness both LE Regular S1 S2 Abdomen not tender No edema  Vital Signs: BP (!) 118/56 (BP Location: Right Arm)   Pulse 65   Temp 97.6 F (36.4 C) (Oral)   Resp 17   Ht 6\' 4"  (1.93 m)   Wt 100.8 kg   SpO2 92%   BMI 27.05 kg/m  SpO2: SpO2: 92 % O2 Device: O2 Device: Nasal Cannula O2 Flow Rate: O2 Flow Rate (L/min): 2 L/min  Intake/output summary:   Intake/Output Summary (Last 24 hours) at 08/03/2019 1009 Last data filed at 08/03/2019 1314 Gross per 24 hour  Intake 40.61 ml  Output 400 ml  Net -359.39 ml   LBM: Last BM Date: 08/01/19 Baseline Weight: Weight: 100.8 kg Most recent weight:  Weight: 100.8 kg       Palliative Assessment/Data:      Patient Active Problem List   Diagnosis Date Noted  . Cancer associated pain 08/02/2019  . Metastatic lung cancer (metastasis from lung to other site) ([place]) 08/02/2019  . Metastatic cancer to spine ([place]) 08/02/2019  . Metastatic cancer to brain ([place]) 08/02/2019  . DNR (do not resuscitate) 08/02/2019  .  Encounter for hospice care discussion 08/02/2019  . Benign prostatic hyperplasia with urinary frequency 09/19/2016  . Colon cancer screening 09/19/2016  . Abnormal ECG 09/19/2016  . Other secondary gout of left hand 02/21/2015  . Arteriosclerotic coronary artery disease 12/31/2013  . Insomnia 12/31/2013  . Arteriosclerosis of coronary artery 12/16/2013  . Essential hypertension 12/16/2013  . Mixed hyperlipidemia 12/16/2013  . Tobacco use 12/16/2013    Palliative Care Assessment & Plan   Patient Profile:  64 year old gentleman with hypertension dyslipidemia coronary artery disease status post stent placement.  Within the last 4-6 weeks, patient was diagnosed with possible metastatic lung cancer with mets to liver, kidney, thyroid, bone, spine and brain.  Records have been reviewed.  Patient finished palliative radiation on January 15.  Patient was treated at [facility].  Patient has bilateral lower extremity weakness/numbness as well as fecal incontinence.  Certainly this is concerning for cord compression.  Patient however declined to undergo MRI earlier in this hospitalization.  He requested for adequate pain management options to be explored.  Patient's outpatient medication regimen was that he was on scheduled MS Contin as well as oxycodone.  Assessment:  incurable metastatic lung cancer Back pain Wide spread metastatic burden Generalized pain LE weakness  Recommendations/Plan:  Continue Dilaudid drip, also has provision for boluses.   Goals of care and disposition discussions were held  directly with the patient. Patient has insight into the serious and incurable nature of his illness. He wishes for aggressive symptom management and comfort focused care.  We talked about hospice philosophy of care, residential hospice versus home with hospice criteria were all discussed. Alternatively, SNF rehab with palliative was also discussed. Patient has severe LE weakness, he wishes for comfort focused care.  Appreciate TOC assistance. Await hospice recommendations.     Code Status:    Code Status Orders  (From admission, onward)         Start     Ordered   08/02/19 0410  Do not attempt resuscitation (DNR)  Continuous    Question Answer Comment  In the event of cardiac or respiratory ARREST Do not call a "code blue"   In the event of cardiac or respiratory ARREST Do not perform Intubation, CPR, defibrillation or ACLS   In the event of cardiac or respiratory ARREST Use medication by any route, position, wound care, and other measures to relive pain and suffering. May use oxygen, suction and manual treatment of airway obstruction as needed for comfort.   Comments Confirmed with patient directly      08/02/19 0411        Code Status History    Date Active Date Inactive Code Status Order ID Comments User Context   08/02/2019 0403 08/02/2019 0411 DNR [id number]  Etta Quill, DO ED   Advance Care Planning Activity    Advance Directive Documentation     Most Recent Value  Type of Advance Directive  Healthcare Power of Attorney  Pre-existing out of facility DNR order (yellow form or pink MOST form)  -  "MOST" Form in Place?  -       Prognosis:   < 2 weeks  Discharge Planning:  Hospice facility  Care plan was discussed with  Patient and [facility] colleague Alinda Sierras.   Thank you for allowing the Palliative Medicine Team to assist in the care of this patient.   Time In: 9 Time Out: 9.35 Total Time 35 Prolonged Time Billed  no  Greater than 50%  of this time was spent  counseling and coordinating care related to the above assessment and plan.  [name], MD  Please contact Palliative Medicine Team phone at [phone] for questions and concerns.

## 2019-08-04 NOTE — Progress Notes (Signed)
PALLIATIVE NOTE:   Patient awake and alert. He reports pain is much more controlled. Is able to tolerate some sips and a few bites of food for lunch. Patient has been accepted at [facility] and is scheduled to transfer today. He verbalized understanding and expressed his family is aware.   All questions answered. Support given.   Plan -DNR -Full Comfort -Pt to d/c later today to [facility] via [place] will continue to support and follow as needed.   Time Total: 15 min.   Visit consisted of counseling and education dealing with the complex and emotionally intense issues of symptom management and palliative care in the setting of serious and potentially life-threatening illness.Greater than 50%  of this time was spent counseling and coordinating care related to the above assessment and plan.  Alda Lea, AGPCNP-BC  Palliative Medicine Team [phone]

## 2019-08-04 NOTE — Progress Notes (Signed)
PT Cancellation Note / Screen  Patient Details Name: [name] MRN: [id number] DOB: 03-27-1956   Cancelled Treatment:    Reason Eval/Treat Not Completed: PT screened, no needs identified, will sign off Pt with poor prognosis and plans for [facility].  Order also discontinued.  PT to sign off.   Johnanna Bakke,KATHrine E 08/04/2019, 10:26 AM Arlyce Dice, DPT Acute Rehabilitation Services Office: [phone]

## 2019-08-04 NOTE — TOC Transition Note (Signed)
Transition of Care [facility]) - CM/SW Discharge Note   Patient Details  Name: [name] MRN: [id number] Date of Birth: 24-Jan-1956  Transition of [facility] CM/SW Contact:  Lynnell Catalan, RN Phone Number: 08/04/2019, 1:11 PM   Clinical Narrative:     Patient to dc to [facility] place today. [facility] contacted for transport. RN to call report to [phone]  Final next level of care: [place] Barriers to Discharge: No Barriers Identified   Readmission Risk Interventions No flowsheet data found.

## 2019-08-04 NOTE — Discharge Summary (Signed)
Discharge Summary  [name] YIF:[id number] DOB: [birth year]  PCP: Antony Blackbird, MD  Admit date: 08/01/2019 Discharge date: 08/04/2019  Time spent: 35 minutes  Recommendations for Outpatient Follow-up:  1. Continue with hospice care  Discharge Diagnoses:  Active Hospital Problems   Diagnosis Date Noted  . Cancer associated pain 08/02/2019  . Metastatic lung cancer (metastasis from lung to other site) ([place]) 08/02/2019  . Metastatic cancer to spine ([place]) 08/02/2019  . Metastatic cancer to brain ([place]) 08/02/2019  . DNR (do not resuscitate) 08/02/2019  . Encounter for hospice care discussion 08/02/2019  . Benign prostatic hyperplasia with urinary frequency 09/19/2016  . Essential hypertension 12/16/2013    Resolved Hospital Problems  No resolved problems to display.    Discharge Condition: Stable   Diet recommendation: Pleasure feedings   Vitals:   08/03/19 2023 08/04/19 0600  BP: 113/63 (!) 101/57  Pulse: 77 61  Resp: 16 18  Temp: 97.9 F (36.6 C) 97.7 F (36.5 C)  SpO2: 92% 92%    History of present illness:  [name] Hilliardis a 64 y.o.malewith medical history significant ofmetastatic lung cancer with mets to liver, kidney, thyroid, bone, spine, and brain, HTN, HLD, CAD s/p 2 stents who presented with complaints of severe lower back pain.  Associated with bilateral lower extremity weakness and numbness.  On presentation patient declined further work-up, requesting for Pain Control.  Seen by Palliative Care Team.  Transition to Hospice Care.  Patient Has a Poor Prognosis.  08/04/19: Seen and examined. No acute events overnight.  Pain is improved today.   Plan is to discharge to [facility] place to continue hospice care.   Hospital Course:  Principal Problem:   Cancer associated pain Active Problems:   Essential hypertension   Benign prostatic hyperplasia with urinary frequency   Metastatic lung cancer (metastasis from lung to other site) [facility])   Metastatic  cancer to spine [facility])   Metastatic cancer to brain [facility])   DNR (do not resuscitate)   Encounter for hospice care discussion  Assessment: Metastatic cancer related generalized pain Metastatic lung cancer Urinary retention post Foley cath insertion Coronary artery disease s/p PCI with stents Essential hypertension DNR  Plan: Patient has been accepted at [facility] Will continue with hospice care Prognosis is very poor.  Code Status:DNR/DNI - confirmed directly with patient   Consults called: Palliative care team.  Transition of care team.   Discharge Exam: BP (!) 101/57 (BP Location: Right Arm)   Pulse 61   Temp 97.7 F (36.5 C) (Oral)   Resp 18   Ht 6\' 4"  (1.93 m)   Wt 100.8 kg   SpO2 92%   BMI 27.05 kg/m  . General: 64 y.o. year-old male well developed well nourished in no acute distress.  Alert and oriented x3. . Cardiovascular: Regular rate and rhythm with no rubs or gallops.  No thyromegaly or JVD noted.   Marland Kitchen Respiratory: Clear to auscultation with no wheezes or rales. Good inspiratory effort. . Abdomen: Soft nontender nondistended with normal bowel sounds x4 quadrants. . Musculoskeletal: No lower extremity edema. 2/4 pulses in all 4 extremities. Marland Kitchen Psychiatry: Mood is appropriate for condition and setting  Discharge Instructions You were cared for by a hospitalist during your hospital stay. If you have any questions about your discharge medications or the care you received while you were in the hospital after you are discharged, you can call the unit and asked to speak with the hospitalist on call if the hospitalist that took care  of you is not available. Once you are discharged, your primary care physician will handle any further medical issues. Please note that NO REFILLS for any discharge medications will be authorized once you are discharged, as it is imperative that you return to your primary care physician (or establish a relationship with a primary care  physician if you do not have one) for your aftercare needs so that they can reassess your need for medications and monitor your lab values.   Allergies as of 08/04/2019   No Known Allergies     Medication List    STOP taking these medications   aspirin 325 MG tablet   buPROPion 150 MG 12 hr tablet Commonly known as: Wellbutrin SR   clopidogrel 75 MG tablet Commonly known as: PLAVIX   dexamethasone 1 MG tablet Commonly known as: DECADRON   fenofibrate 160 MG tablet   gabapentin 600 MG tablet Commonly known as: NEURONTIN   hydrochlorothiazide 25 MG tablet Commonly known as: HYDRODIURIL   lisinopril 40 MG tablet Commonly known as: ZESTRIL   metoprolol tartrate 50 MG tablet Commonly known as: LOPRESSOR   morphine 60 MG 12 hr tablet Commonly known as: MS CONTIN   oxyCODONE-acetaminophen 10-325 MG tablet Commonly known as: PERCOCET   pantoprazole 40 MG tablet Commonly known as: PROTONIX   rosuvastatin 20 MG tablet Commonly known as: CRESTOR   tamsulosin 0.4 MG Caps capsule Commonly known as: FLOMAX      No Known Allergies    The results of significant diagnostics from this hospitalization (including imaging, microbiology, ancillary and laboratory) are listed below for reference.    Significant Diagnostic Studies: DG Thoracic Spine 2 View  Result Date: 08/02/2019 CLINICAL DATA:  Back pain. History of metastatic cancer. EXAM: THORACIC SPINE 2 VIEWS COMPARISON:  Thoracic spine report 07/08/2019 at an outside institution, images not available. FINDINGS: Known T3 compression fracture with underlying lesion not well characterized radiographically. Known T6 lesion described on MRI is also not well seen. No evidence of acute compression fracture. Overall alignment is maintained. IMPRESSION: Previously described T3 and T6 vertebral body lesions with mild T3 compression fracture on MRI are not demonstrated radiographically. No radiographic evidence of acute abnormality.  Electronically Signed   By: Keith Rake M.D.   On: 08/02/2019 01:13   DG Lumbar Spine Complete  Result Date: 08/02/2019 CLINICAL DATA:  Back pain.  History of metastatic cancer. EXAM: LUMBAR SPINE - COMPLETE 4+ VIEW COMPARISON:  Report from lumbar spine MRI 07/04/2019 at an outside institution, images not available. FINDINGS: Abnormal decreased density involving L5 vertebral body with moderate compression fracture. Previously described abnormal signal within L3 and L4 vertebra on MRI have no definite radiographic correlate. The remainder of the lumbar vertebral body heights are preserved. Degenerative change of bilateral sacroiliac joints. IMPRESSION: Abnormal decreased density involving L5 vertebral body with moderate compression fracture. Prior lumbar spine MRI demonstrated pathologic fracture with underlying bone lesion. Images not available for direct comparison. Previously described abnormal signal within L3 and L4 vertebra have no definite radiographic correlate. Electronically Signed   By: Keith Rake M.D.   On: 08/02/2019 01:10   CT Head Wo Contrast  Result Date: 08/02/2019 CLINICAL DATA:  Encephalopathy EXAM: CT HEAD WITHOUT CONTRAST TECHNIQUE: Contiguous axial images were obtained from the base of the skull through the vertex without intravenous contrast. COMPARISON:  None. FINDINGS: Brain: There is no mass, hemorrhage or extra-axial collection. The size and configuration of the ventricles and extra-axial CSF spaces are normal. There is a  small chronic lacunar infarct of the right basal ganglia. Brain parenchyma is otherwise normal. Vascular: No abnormal hyperdensity of the major intracranial arteries or dural venous sinuses. No intracranial atherosclerosis. Skull: The visualized skull base, calvarium and extracranial soft tissues are normal. Sinuses/Orbits: Complete opacification of the left maxillary sinus. The orbits are normal. IMPRESSION: 1. No acute intracranial abnormality. 2.  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 3. Left maxillary sinus opacification. Electronically Signed   By: Ulyses Jarred M.D.   On: 08/02/2019 01:28    Microbiology: Recent Results (from the past 240 hour(s))  SARS CORONAVIRUS 2 (TAT 6-24 HRS) Nasopharyngeal Nasopharyngeal Swab     Status: None   Collection Time: 08/02/19  3:24 AM   Specimen: Nasopharyngeal Swab  Result Value Ref Range Status   SARS Coronavirus 2 NEGATIVE NEGATIVE Final    Comment: (NOTE) SARS-CoV-2 target nucleic acids are NOT DETECTED. The SARS-CoV-2 RNA is generally detectable in upper and lower respiratory specimens during the acute phase of infection. Negative results do not preclude SARS-CoV-2 infection, do not rule out co-infections with other pathogens, and should not be used as the sole basis for treatment or other patient management decisions. Negative results must be combined with clinical observations, patient history, and epidemiological information. The expected result is Negative. Fact Sheet for Patients: SugarRoll[employer]be Fact Sheet for Healthcare Providers: https://www.woods-mathews.com/ This test is not yet approved or cleared by the [place] FDA and  has been authorized for detection and/or diagnosis of SARS-CoV-2 by FDA under an Emergency Use Authorization (EUA). This EUA will remain  in effect (meaning this test can be used) for the duration of the COVID-19 declaration under Section 56 4(b)(1) of the Act, 21 U.S.C. section 360bbb-3(b)(1), unless the authorization is terminated or revoked sooner. Performed at [facility], [place] [address] [place], [place] [address]      Labs: Basic Metabolic Panel: Recent Labs  Lab 08/01/19 2342  NA 140  K 4.0  CL 103  CO2 28  GLUCOSE 118*  BUN 23  CREATININE 0.63  CALCIUM 9.8   Liver Function Tests: Recent Labs  Lab 08/01/19 2342  AST 24  ALT 55*  ALKPHOS 77  BILITOT 1.4*  PROT  6.2*  ALBUMIN 3.1*   No results for input(s): LIPASE, AMYLASE in the last 168 hours. No results for input(s): AMMONIA in the last 168 hours. CBC: Recent Labs  Lab 08/01/19 2342  WBC 9.8  NEUTROABS 8.2*  HGB 16.1  HCT 48.7  MCV 92.8  PLT 110*   Cardiac Enzymes: No results for input(s): CKTOTAL, CKMB, CKMBINDEX, TROPONINI in the last 168 hours. BNP: BNP (last 3 results) No results for input(s): BNP in the last 8760 hours.  ProBNP (last 3 results) No results for input(s): PROBNP in the last 8760 hours.  CBG: No results for input(s): GLUCAP in the last 168 hours.     Signed:  Kayleen Memos, MD [facility] 08/04/2019, 12:20 PM

## 2019-08-04 NOTE — Plan of Care (Signed)
Patient discharged to [facility] with [facility] staff. Report called to [facility]

## 2019-08-10 DEATH — deceased

## 2021-08-11 IMAGING — CR DG LUMBAR SPINE COMPLETE 4+V
5 series · 5 of 5 positions shown · non-contrast
Comparison: Report from lumbar spine MRI 07/04/2019 at an outside
institution, images not available.

CLINICAL DATA: Back pain.  History of metastatic cancer.

EXAM:
LUMBAR SPINE - COMPLETE 4+ VIEW

[t lumbar spine obl (1 of 3)]
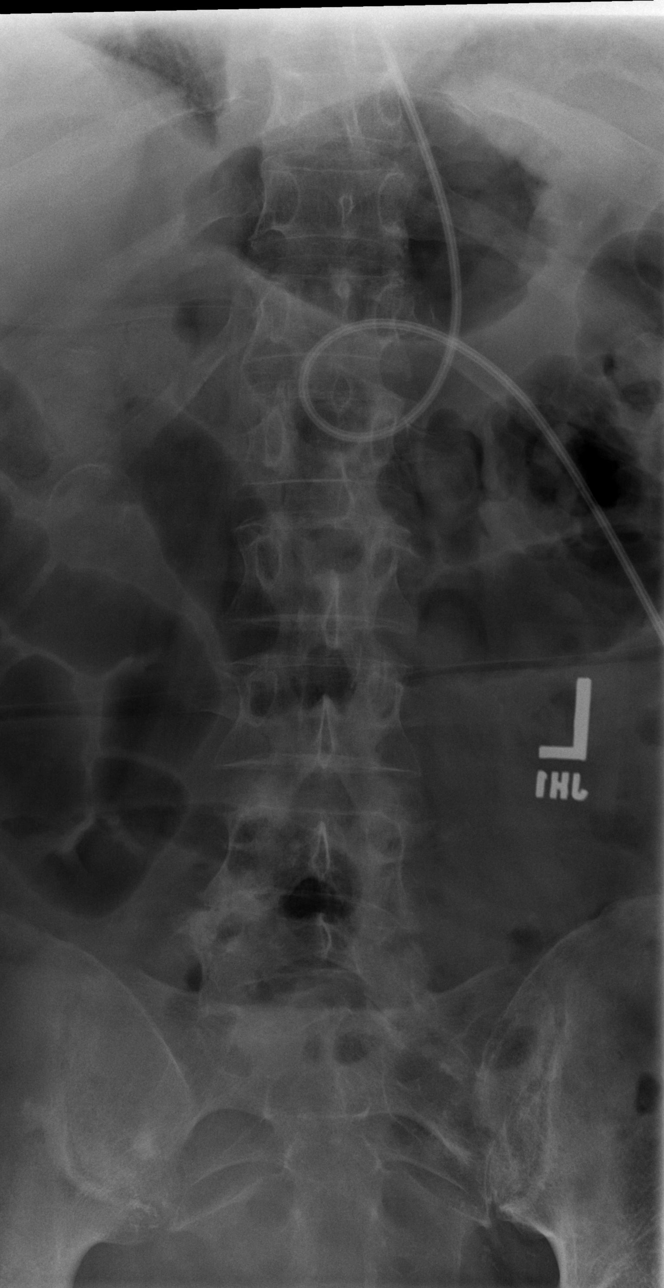

[t lumbar spine obl (2 of 3)]
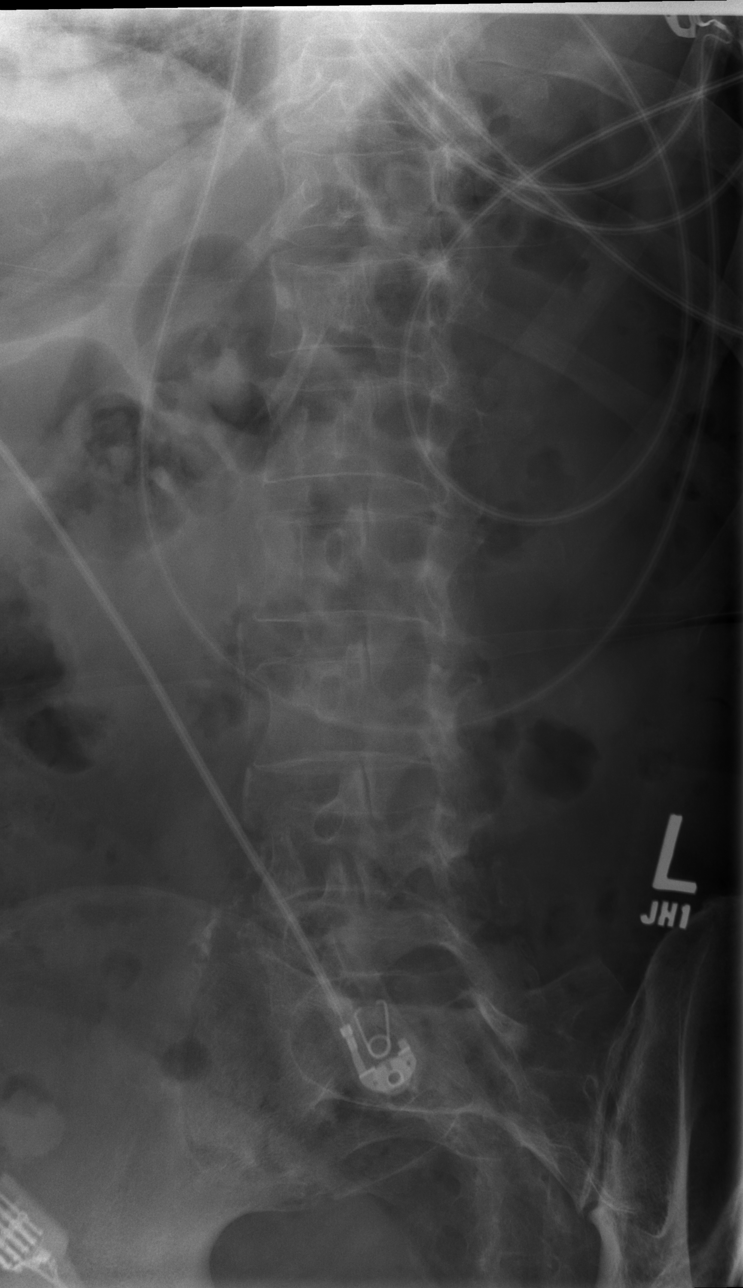

[t lumbar spine obl (3 of 3)]
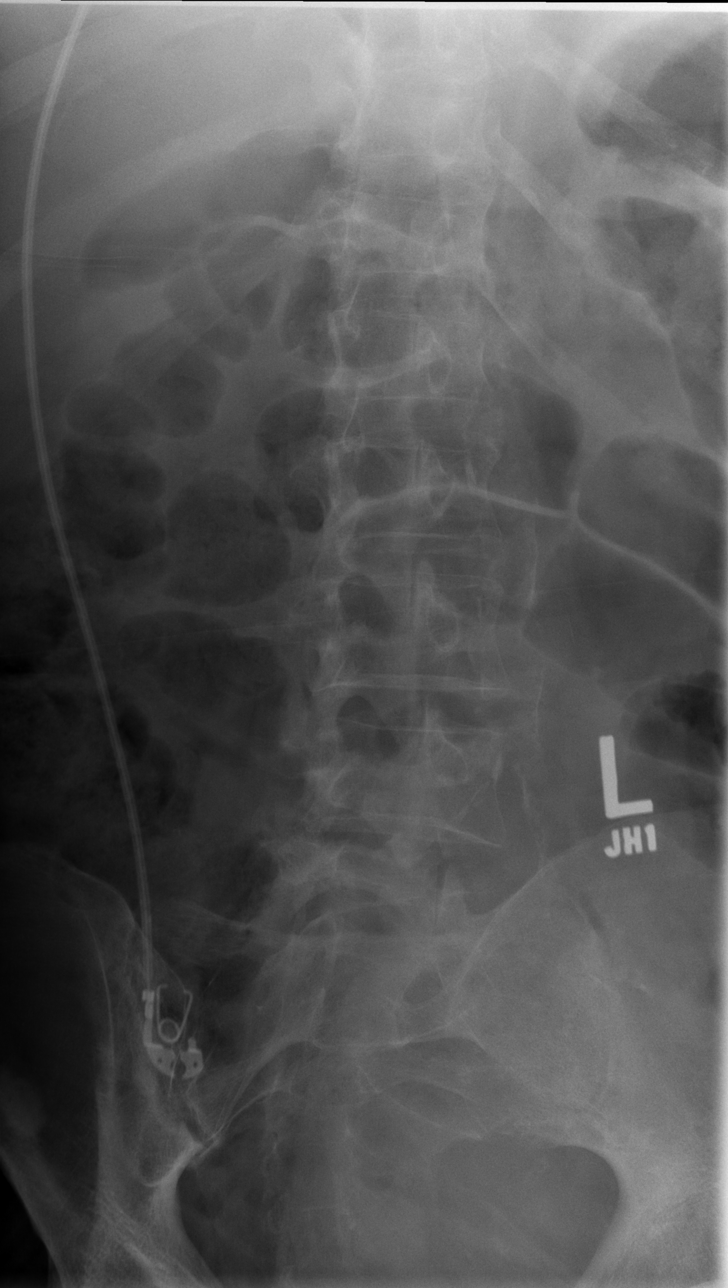

[t lumbar spine lat]
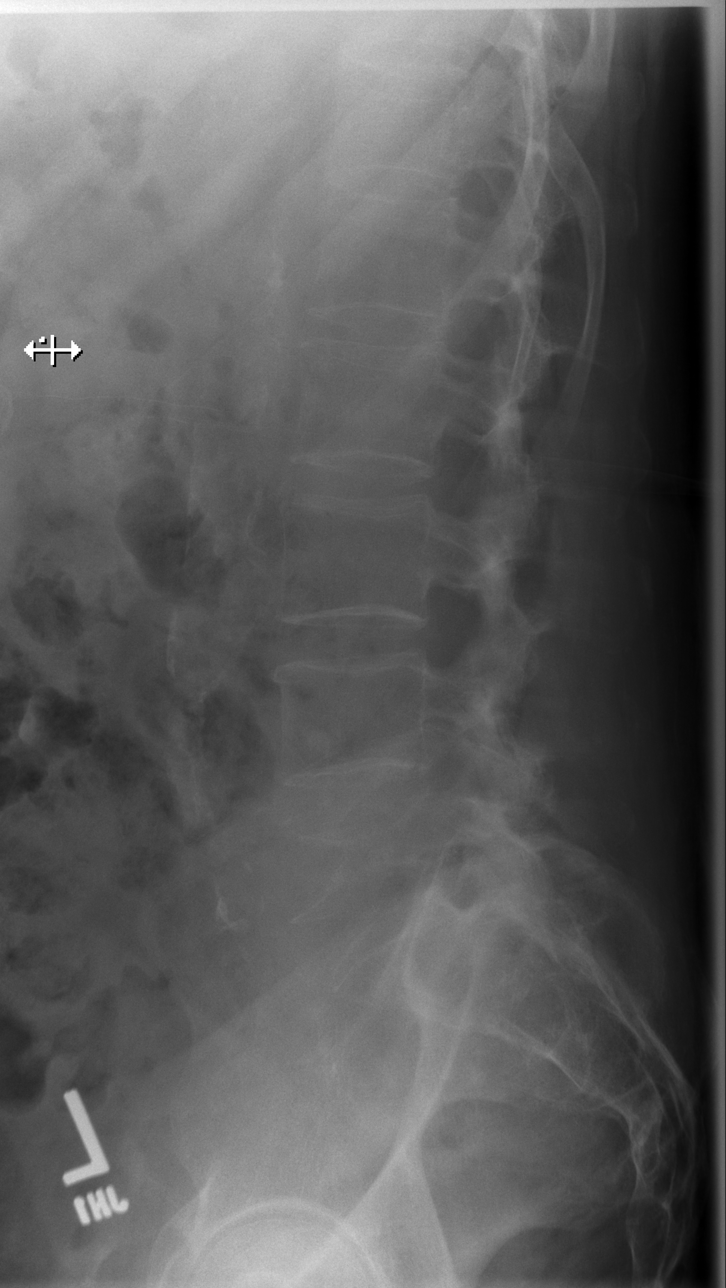

[t lumbar l-5 s-1 spot]
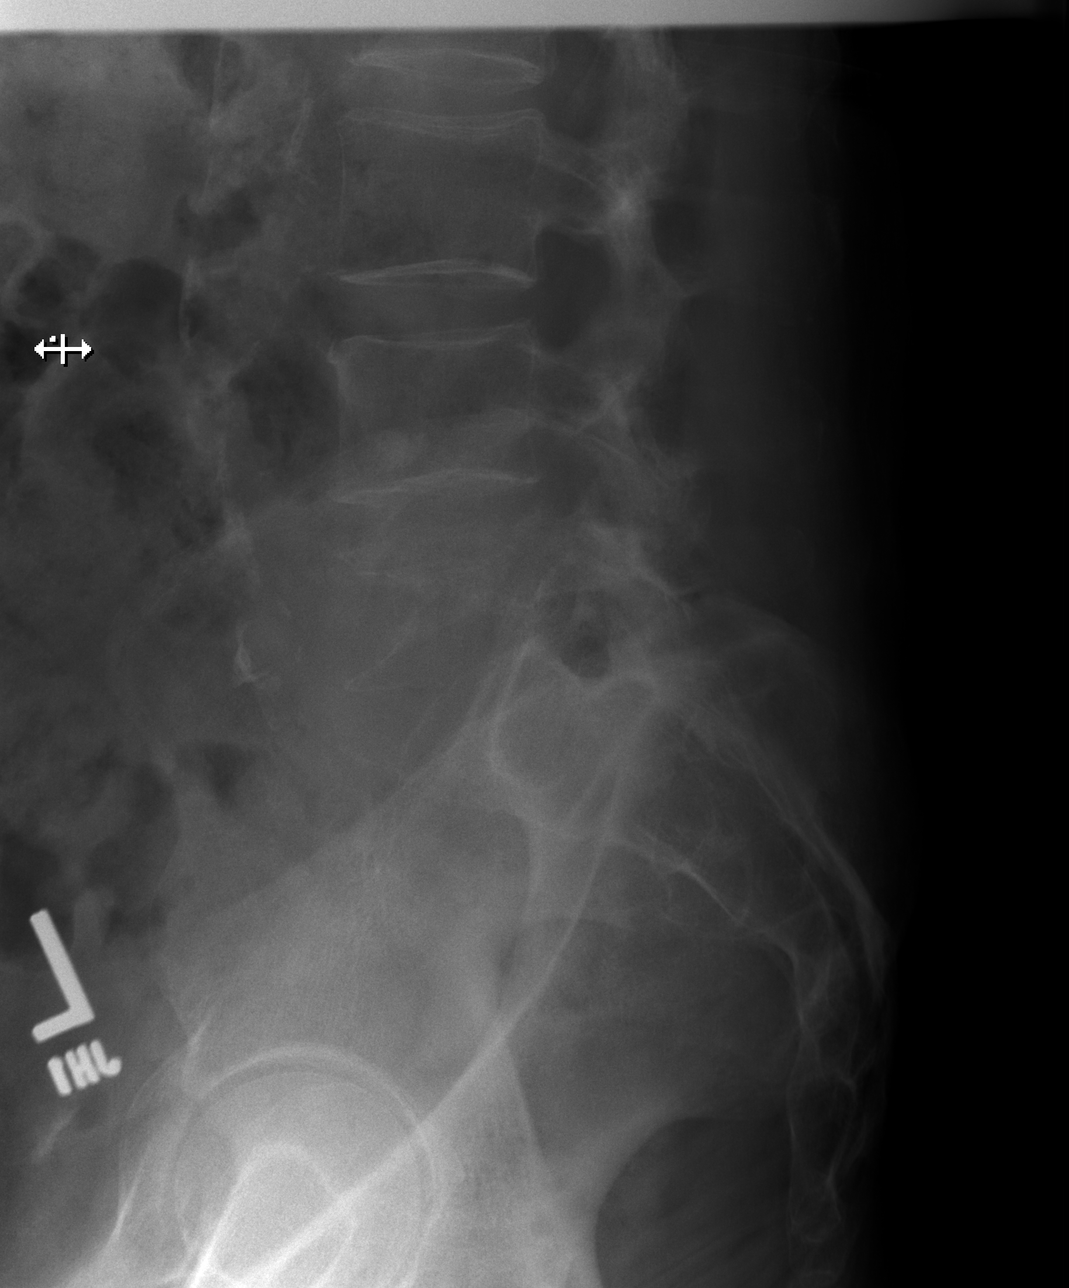

[5 of 5 positions shown; findings below may reference images not displayed]

FINDINGS: Abnormal decreased density involving L5 vertebral body with moderate
compression fracture. Previously described abnormal signal within L3
and L4 vertebra on MRI have no definite radiographic correlate. The
remainder of the lumbar vertebral body heights are preserved.
Degenerative change of bilateral sacroiliac joints.
IMPRESSION: Abnormal decreased density involving L5 vertebral body with moderate
compression fracture. Prior lumbar spine MRI demonstrated pathologic
fracture with underlying bone lesion. Images not available for
direct comparison. Previously described abnormal signal within L3
and L4 vertebra have no definite radiographic correlate.
